# Patient Record
Sex: Male | Born: 1950
Health system: Southern US, Community
[De-identification: ages and names within clinical notes are randomized; demographics above are authoritative.]

## PROBLEM LIST (undated history)

## (undated) DIAGNOSIS — Z85828 Personal history of other malignant neoplasm of skin: Secondary | ICD-10-CM

## (undated) DIAGNOSIS — I444 Left anterior fascicular block: Secondary | ICD-10-CM

## (undated) DIAGNOSIS — I4510 Unspecified right bundle-branch block: Secondary | ICD-10-CM

## (undated) DIAGNOSIS — B019 Varicella without complication: Secondary | ICD-10-CM

## (undated) DIAGNOSIS — R6882 Decreased libido: Secondary | ICD-10-CM

## (undated) DIAGNOSIS — K602 Anal fissure, unspecified: Secondary | ICD-10-CM

## (undated) DIAGNOSIS — F329 Major depressive disorder, single episode, unspecified: Secondary | ICD-10-CM

## (undated) DIAGNOSIS — E785 Hyperlipidemia, unspecified: Secondary | ICD-10-CM

## (undated) HISTORY — DX: Varicella without complication: B01.9

## (undated) HISTORY — DX: Left anterior fascicular block: I44.4

## (undated) HISTORY — DX: Hyperlipidemia, unspecified: E78.5

## (undated) HISTORY — DX: Unspecified right bundle-branch block: I45.10

## (undated) HISTORY — PX: NASAL POLYP SURGERY: SHX186

## (undated) HISTORY — DX: Personal history of other malignant neoplasm of skin: Z85.828

## (undated) HISTORY — DX: Decreased libido: R68.82

## (undated) HISTORY — PX: TONSILLECTOMY: SUR1361

## (undated) HISTORY — DX: Major depressive disorder, single episode, unspecified: F32.9

## (undated) HISTORY — DX: Anal fissure, unspecified: K60.2

## (undated) HISTORY — PX: KNEE ARTHROSCOPY: SUR90

---

## 1998-08-02 ENCOUNTER — Ambulatory Visit (HOSPITAL_BASED_OUTPATIENT_CLINIC_OR_DEPARTMENT_OTHER): Admission: RE | Admit: 1998-08-02 | Discharge: 1998-08-02 | Payer: Self-pay | Admitting: General Surgery

## 2002-10-17 ENCOUNTER — Ambulatory Visit (HOSPITAL_BASED_OUTPATIENT_CLINIC_OR_DEPARTMENT_OTHER): Admission: RE | Admit: 2002-10-17 | Discharge: 2002-10-17 | Payer: Self-pay | Admitting: Otolaryngology

## 2002-11-16 ENCOUNTER — Encounter (INDEPENDENT_AMBULATORY_CARE_PROVIDER_SITE_OTHER): Payer: Self-pay | Admitting: Specialist

## 2002-11-16 ENCOUNTER — Ambulatory Visit (HOSPITAL_BASED_OUTPATIENT_CLINIC_OR_DEPARTMENT_OTHER): Admission: RE | Admit: 2002-11-16 | Discharge: 2002-11-17 | Payer: Self-pay | Admitting: Otolaryngology

## 2003-07-21 HISTORY — PX: SHOULDER ARTHROSCOPY W/ ROTATOR CUFF REPAIR: SHX2400

## 2008-05-14 ENCOUNTER — Encounter: Payer: Self-pay | Admitting: Family Medicine

## 2008-05-21 ENCOUNTER — Encounter: Payer: Self-pay | Admitting: Family Medicine

## 2008-06-11 ENCOUNTER — Encounter: Payer: Self-pay | Admitting: Family Medicine

## 2008-07-20 LAB — HM COLONOSCOPY: HM Colonoscopy: NORMAL

## 2008-12-25 ENCOUNTER — Ambulatory Visit: Payer: Self-pay | Admitting: Family Medicine

## 2008-12-25 DIAGNOSIS — F329 Major depressive disorder, single episode, unspecified: Secondary | ICD-10-CM

## 2008-12-25 DIAGNOSIS — Z85828 Personal history of other malignant neoplasm of skin: Secondary | ICD-10-CM

## 2008-12-25 DIAGNOSIS — F3289 Other specified depressive episodes: Secondary | ICD-10-CM

## 2008-12-25 DIAGNOSIS — R6882 Decreased libido: Secondary | ICD-10-CM

## 2008-12-25 HISTORY — DX: Major depressive disorder, single episode, unspecified: F32.9

## 2008-12-25 HISTORY — DX: Other specified depressive episodes: F32.89

## 2008-12-25 HISTORY — DX: Personal history of other malignant neoplasm of skin: Z85.828

## 2008-12-25 HISTORY — DX: Decreased libido: R68.82

## 2009-02-01 ENCOUNTER — Encounter: Payer: Self-pay | Admitting: Family Medicine

## 2009-03-05 ENCOUNTER — Ambulatory Visit: Payer: Self-pay | Admitting: Family Medicine

## 2009-03-05 LAB — CONVERTED CEMR LAB
ALT: 22 units/L (ref 0–53)
Albumin: 3.9 g/dL (ref 3.5–5.2)
BUN: 14 mg/dL (ref 6–23)
Basophils Absolute: 0.1 10*3/uL (ref 0.0–0.1)
Bilirubin Urine: NEGATIVE
CO2: 26 meq/L (ref 19–32)
Chloride: 109 meq/L (ref 96–112)
Glucose, Bld: 113 mg/dL — ABNORMAL HIGH (ref 70–99)
HCT: 42.6 % (ref 39.0–52.0)
HDL: 50.6 mg/dL (ref >39.00)
Hemoglobin: 15 g/dL (ref 13.0–17.0)
Ketones, ur: NEGATIVE mg/dL
Leukocytes, UA: NEGATIVE
Lymphs Abs: 1 10*3/uL (ref 0.7–4.0)
MCV: 88.6 fL (ref 78.0–100.0)
Monocytes Absolute: 0.5 10*3/uL (ref 0.1–1.0)
Neutro Abs: 3.5 10*3/uL (ref 1.4–7.7)
Nitrite: NEGATIVE
Platelets: 206 10*3/uL (ref 150.0–400.0)
Potassium: 3.8 meq/L (ref 3.5–5.1)
RDW: 12.5 % (ref 11.5–14.6)
Sodium: 140 meq/L (ref 135–145)
Specific Gravity, Urine: 1.025 (ref 1.000–1.030)
TSH: 0.8 microintl units/mL (ref 0.35–5.50)
Total Bilirubin: 1.2 mg/dL (ref 0.3–1.2)
Urobilinogen, UA: 0.2 (ref 0.0–1.0)
pH: 6 (ref 5.0–8.0)

## 2009-03-15 ENCOUNTER — Ambulatory Visit: Payer: Self-pay | Admitting: Family Medicine

## 2009-04-19 HISTORY — PX: FRACTURE SURGERY: SHX138

## 2009-05-17 ENCOUNTER — Ambulatory Visit (HOSPITAL_BASED_OUTPATIENT_CLINIC_OR_DEPARTMENT_OTHER): Admission: RE | Admit: 2009-05-17 | Discharge: 2009-05-17 | Payer: Self-pay | Admitting: Orthopedic Surgery

## 2009-05-24 ENCOUNTER — Telehealth: Payer: Self-pay | Admitting: Family Medicine

## 2009-09-20 ENCOUNTER — Ambulatory Visit: Payer: Self-pay | Admitting: Family Medicine

## 2010-03-11 ENCOUNTER — Ambulatory Visit: Payer: Self-pay | Admitting: Family Medicine

## 2010-07-09 ENCOUNTER — Ambulatory Visit: Payer: Self-pay | Admitting: Family Medicine

## 2010-08-19 NOTE — Assessment & Plan Note (Signed)
Summary: fup on meds//ccm   Vital Signs:  Patient profile:   60 year old male Weight:      222 pounds BMI:     31.07 Temp:     98.3 degrees F oral BP sitting:   130 / 78  (left arm) Cuff size:   large  Vitals Entered By: Sid Falcon LPN (March 11, 2010 8:40 AM)  History of Present Illness: Patient here for followup depression. Takes Zoloft and bupropion. Symptoms well-controlled. He is sleeping well. Positive mood. Has moved recently to [place] for job and is back here visiting. He plans to eventually get his own physician up there.  Exercises daily with walking. No suicidal ideation. Good appetite. No anhedonia.  Allergies: 1)  ! Cipro (Ciprofloxacin Hcl)  Past History:  Past Medical History: Last updated: 12/25/2008 Skin cancer, hx of, basal cell Depression Chicken pox Dyslipidemia Recurrent nasal polyps Pre diabetes by recent labs PMH reviewed for relevance  Physical Exam  General:  Well-developed,well-nourished,in no acute distress; alert,appropriate and cooperative throughout examination Neck:  No deformities, masses, or tenderness noted. Lungs:  Normal respiratory effort, chest expands symmetrically. Lungs are clear to auscultation, no crackles or wheezes. Heart:  Normal rate and regular rhythm. S1 and S2 normal without gallop, murmur, click, rub or other extra sounds. Psych:  normally interactive, good eye contact, not anxious appearing, and not depressed appearing.     Impression & Recommendations:  Problem # 1:  DEPRESSION (ICD-311) stable.  He will consider tapering off antidepressants in the coming year. His updated medication list for this problem includes:    Zoloft 50 Mg Tabs (Sertraline hcl) ..... Once daily    Bupropion Hcl 300 Mg Xr24h-tab (Bupropion hcl) ..... One by mouth once daily  Complete Medication List: 1)  Restasis 0.05 % Emul (Cyclosporine) .... One drop to each eye daily 2)  Singulair 10 Mg Tabs (Montelukast sodium) .... Once daily 3)   Zoloft 50 Mg Tabs (Sertraline hcl) .... Once daily 4)  Bupropion Hcl 300 Mg Xr24h-tab (Bupropion hcl) .... One by mouth once daily 5)  Ambien 10 Mg Tabs (Zolpidem tartrate) .... One by mouth at bedtime as needed insomnia  Patient Instructions: 1)  Please schedule a follow-up appointment in 1 year.  Prescriptions: BUPROPION HCL 300 MG XR24H-TAB (BUPROPION HCL) one by mouth once daily  #90 x 3   Entered and Authorized by:   Evelena Peat MD   Signed by:   Evelena Peat MD on 03/11/2010   Method used:   Faxed to ...       [facility] (mail-order)             , [place]         Ph:        Fax: [phone]   RxID:   [id number] ZOLOFT 50 MG TABS (SERTRALINE HCL) once daily  #90 x 3   Entered and Authorized by:   Evelena Peat MD   Signed by:   Evelena Peat MD on 03/11/2010   Method used:   Faxed to ...       [facility] (mail-order)             , [place]         Ph:        Fax: [phone]   RxID:   [id number] ZOLOFT 50 MG TABS (SERTRALINE HCL) once daily  #30 x 0   Entered and Authorized by:   Evelena Peat MD   Signed by:  Evelena Peat MD on 03/11/2010   Method used:   Electronically to        [facility]  [phone]* (retail)       [address] [place], [place]  [address]       Ph: [phone] or [phone]       Fax: [phone]   RxID:   [id number]

## 2010-08-19 NOTE — Assessment & Plan Note (Signed)
Summary: HEP A INJ/CJR  Nurse Visit   Allergies: 1)  ! Cipro (Ciprofloxacin Hcl)  Immunizations Administered:  Hepatitis A Vaccine # 2:    Vaccine Type: HepA    Site: left deltoid    Mfr: GlaxoSmithKline    Dose: 0.1 ml    Route: IM    Given by: Sid Falcon LPN    Exp. Date: 11/06/2011    Lot #: [id number]  Orders Added: 1)  Hepatitis A Vaccine (Adult Dose) [90632] 2)  Admin 1st Vaccine [90471] Prescriptions: AMBIEN 10 MG TABS (ZOLPIDEM TARTRATE) one by mouth at bedtime as needed insomnia  #30 x 2   Entered and Authorized by:   Evelena Peat MD   Signed by:   Evelena Peat MD on 09/20/2009   Method used:   Print then Give to Patient   RxID:   [id number]

## 2010-08-21 NOTE — Assessment & Plan Note (Signed)
Summary: fu  on meds/njr   Vital Signs:  Patient profile:   60 year old male Weight:      215 pounds Temp:     97.6 degrees F oral BP sitting:   120 / 70  (left arm) Cuff size:   large  Vitals Entered By: Sid Falcon LPN (July 09, 2010 8:48 AM) CC: follow-up visit on meds , Back Pain Is Patient Diabetic? No   History of Present Illness: Hx depression stable.  Ambulating to work and increased diaphoresis. Would like to taper back Wellbutrin.  Mood is stable.  On current meds for several years.   Allergies: 1)  ! Cipro (Ciprofloxacin Hcl)  Past History:  Past Medical History: Last updated: 12/25/2008 Skin cancer, hx of, basal cell Depression Chicken pox Dyslipidemia Recurrent nasal polyps Pre diabetes by recent labs PMH reviewed for relevance  Review of Systems  The patient denies anorexia, fever, chest pain, dyspnea on exertion, peripheral edema, prolonged cough, and headaches.    Physical Exam  General:  Well-developed,well-nourished,in no acute distress; alert,appropriate and cooperative throughout examination Neck:  No deformities, masses, or tenderness noted. Lungs:  Normal respiratory effort, chest expands symmetrically. Lungs are clear to auscultation, no crackles or wheezes. Heart:  Normal rate and regular rhythm. S1 and S2 normal without gallop, murmur, click, rub or other extra sounds. Psych:  normally interactive, good eye contact, not anxious appearing, and not depressed appearing.     Impression & Recommendations:  Problem # 1:  DEPRESSION (ICD-311) Assessment Improved taper back Wellbutrin which should help with diaphoresis His updated medication list for this problem includes:    Zoloft 50 Mg Tabs (Sertraline hcl) ..... Once daily    Bupropion Hcl 150 Mg Xr24h-tab (Bupropion hcl) ..... One by mouth once daily  Complete Medication List: 1)  Restasis 0.05 % Emul (Cyclosporine) .... One drop to each eye daily 2)  Singulair 10 Mg Tabs  (Montelukast sodium) .... Once daily 3)  Zoloft 50 Mg Tabs (Sertraline hcl) .... Once daily 4)  Bupropion Hcl 150 Mg Xr24h-tab (Bupropion hcl) .... One by mouth once daily 5)  Ambien 10 Mg Tabs (Zolpidem tartrate) .... One by mouth at bedtime as needed insomnia  Other Orders: Admin 1st Vaccine (01027) Flu Vaccine 80yrs + (25366) Prescriptions: BUPROPION HCL 150 MG XR24H-TAB (BUPROPION HCL) one by mouth once daily  #30 x 11   Entered and Authorized by:   Evelena Peat MD   Signed by:   Evelena Peat MD on 07/09/2010   Method used:   Print then Give to Patient   RxID:   [id number]    Orders Added: 1)  Est. Patient Level III [64332] 2)  Admin 1st Vaccine [90471] 3)  Flu Vaccine 8yrs + [95188]   Flu Vaccine Consent Questions     Do you have a history of severe allergic reactions to this vaccine? no    Any prior history of allergic reactions to egg and/or gelatin? no    Do you have a sensitivity to the preservative Thimersol? no    Do you have a past history of Guillan-Barre Syndrome? no    Do you currently have an acute febrile illness? no    Have you ever had a severe reaction to latex? no    Vaccine information given and explained to patient? yes    Are you currently pregnant? no    Lot Number:[id number]   Exp Date:01/17/2011   Site Given  Left Deltoid IM        .  lbflu

## 2010-10-23 LAB — POCT HEMOGLOBIN-HEMACUE: Hemoglobin: 14.8 g/dL (ref 13.0–17.0)

## 2010-12-05 NOTE — Op Note (Signed)
NAME:  Eric [name], Eric [name]                        ACCOUNT NO.:  [id number]   MEDICAL RECORD NO.:  [id number]                   PATIENT TYPE:  AMB   LOCATION:  [facility]                                  FACILITY:  [facility]   PHYSICIAN:  Kinnie Scales. Annalee Genta, M.D.            DATE OF BIRTH:  September 17, 1950   DATE OF PROCEDURE:  11/16/2002  DATE OF DISCHARGE:                                 OPERATIVE REPORT   PREOPERATIVE DIAGNOSES:  1. Chronic sinusitis.  2. Nasal polyposis.  3. Deviated nasal septum.  4. Inferior turbinate hypertrophy.   POSTOPERATIVE DIAGNOSES:  1. Chronic sinusitis.  2. Nasal polyposis.  3. Deviated nasal septum.  4. Inferior turbinate hypertrophy.   INDICATIONS FOR PROCEDURE:  1. Chronic sinusitis.  2. Nasal polyposis.  3. Deviated nasal septum.  4. Inferior turbinate hypertrophy.   LOCATION:  Outpatient setting.   ANESTHESIA:  An otherwise healthy patient requiring general anesthesia.   SURGICAL PROCEDURE:  1. Bilateral endoscopic sinus surgery under fluoroscopic guidance consisting     of  bilateral total ethmoidectomy, bilateral maxillary antrostomy with     removal of diseased sinus tissue and left sphenoidotomy.  2. Nasal septoplasty.  3. Bilateral inferior turbinate electrocautery.   ANESTHESIA:  General endotracheal.   COMPLICATIONS:  None.   ESTIMATED BLOOD LOSS:  250 cc.   DISPOSITION:  The patient transferred from the operating room to the  recovery room in stable condition.   BRIEF HISTORY:  The patient is a 60 year old white male who was referred for  evaluation of chronic nasal airway obstruction, hyposmia and chronic  sinusitis.  Examination in the office showed severe nasal polyposis and  deviated nasal septum and a CT scan was obtained which showed complete  opacification of the maxillary, ethmoid and sphenoid sinuses.  The patient  had hypoplastic frontal sinus and severely deviated nasal septum.  Given the  progressive nature of the patient's  symptoms and findings on examination,  the InstaTrak CT scan was obtained and the patient was scheduled for the  above surgical procedures.  Prior to surgery, he was pretreated with a one  week course of a Medrol taper in order to reduce bleeding and polypoid  swelling.  The risks and benefits and possible complications of the surgical  procedure were discussed in detail with the patient and his wife at the  preoperative appointment several days before surgery.  The risks and  benefits and possible complications were discussed in detail and they  understood and concurred with our plan for surgery which was scheduled as  above.   DESCRIPTION OF PROCEDURE:  The patient was brought to the operating room on  11/16/02 and placed in a supine position on the operating room table.  General endotracheal anesthesia was established without difficulty and the  patient was adequately anesthetized.  He was injected with a total of 15 cc  of 1% lidocaine with 1:100,000 solution epinephrine.  This was  injected in  the submucosal fashion on the nasal septum, inferior turbinates, lateral  nasal wall, uncinate process, nasal polyps and middle turbinate.  He was  also injected via transoral sphenopalatine block.  The patient's nose was  then packed with Afrin-soaked cottonoid pledgets that were left in place for  approximately 10 minutes to allow for vasoconstriction anesthesia.  The  patient was prepped and draped in a sterile fashion and the InstaTrak head  gear was applied.  Anatomic and surgical landmarks were identified and  confirmed.  The procedure was begun on the patient's right-hand side.  Using  a 0 degree endoscope, removal of the cottonoid pledgets and examination of  the nasal cavity.  He was found to have a severely deviated nasal septum  with inferior septal spurring and partial obstruction.  The middle meatus  and superior nasal cavity were completely occluded with polypoid material.  Using a  0 degree telescope and a straight microdebrider, polypoid disease  was resected from the nasal cavity and middle meatus to the level of the  ethmoid bulla.  The uncinate process was identified, reflected medially and  resected in its entirety with through cutting forceps.  The ethmoid bulla  was transected using the microdebrider and dissected from inferior to  superior.  Using the [facility] navigation system for guidance, dissection  was carried from anterior to posterior through the ethmoid air cells.  Significant polypoid material was encountered throughout the ethmoid region.  The posterior ethmoid air cells were identified.  This was confirmed with  the InstaTrak and the dissection was carried out from posterior to anterior  along the roof of the ethmoid sinus.  Diseased mucosa, polyps and bony  septations were resected using through cutting forceps and a microdebrider.  A 30 degree upcutting microdebrider was then used along the roof of the  ethmoid sinus to remove residual polypoid material.  Attention was then  turned to the lateral nasal wall where residual uncinate process was  resected.  The natural os of the maxillary sinus was identified.  It was  enlarged in an anterior, inferior and posterior direction.  Large polypoid  material was then resected from within the right maxillary sinus, leaving  the maxillary sinus ostia patent and the majority of the polypoid material  resected.   Attention was then turned to the patient's nasal septum.  A right  hemitransfixion incision was created and this was carried through the mucosa  and drawing submucosa and the mucoperiosteal flap was elevated from anterior  to posterior along the patient's right-hand side.  Severely deviated bone  and cartilage were resected using through cutting forceps and a 4 mm  osteotome.  A large inferior septal spur was resected, preserving the overlying mucosa.  With resection brought to the midline, anterior  cartilage  was morselized and returned to the mucoperichondrial pocket.  Columellar and  dorsal nasal cartilage were not implemented as these were not significantly  deviated.  The mucoperichondrial flaps were reapproximated with a 4-0 gut  suture on a Keith needle in a horizontal mattress running fashion and the  hemitransfixion incision was closed with the same stitch.  At the conclusion  of the surgical procedure, bilateral Doyle nasal septal splints were placed  after the application of Bactroban ointment and sutured into position with a  3-0 Ethilon suture.   Attention was then turned to the patient's left-hand side where endoscopic  sinus surgery was undertaken.  Cottonoid pledgets were removed.  The nasal  cavity  was examined.  The patient was found to have a severe lateralization  of the left middle turbinate with complete occlusion of the left middle  meatus.  The middle turbinate was gently medialized and heavy polypoid  material was encountered within the middle meatus.  This was resected using  a straight microdebrider.  The uncinate process was identified and reflected  medially.  It was resected in its entirety from inferior to superior.  The  ethmoid bulla was identified.  This was also resected from inferior to  superior.  Resection was then carried out through the ethmoid air cells from  anterior to posterior.  Significant polypoid disease was encountered and  this was resected in its entirety.  The posterior aspect of the middle  turbinate was then partially resected and the natural ostia of the sphenoid  sinus was identified.  This was enlarged in an inferior and lateral  direction and polypoid material was removed from the meatus.  The sinus  itself was inspected.  There was minimal polypoid material and no tissue was  resected.  Using a 30 degree telescope, the roof of the ethmoid sinus was  identified under direct visualization and InstaTrak guidance.  The roof of   the ethmoid sinus was dissected free of polypoid material and bony  septations.  Attention was then turned to the lateral nasal wall where the  natural ostium of the maxillary sinus was identified.  Again, residual  uncinate process was resected, creating a widely patent left maxillary  ostium.  Diseased mucosa went into the maxillary sinus including polyps were  then resected using a curved microdebrider.   Attention was then turned to the patient's inferior turbinates where  bilateral inferior turbinates electrocautery was performed.  The cautery was  set at 12 watts and two passes were made in a submucosal fashion of each  inferior turbinate.  The inferior turbinates were then out fractured to  create a more patent nasal cavity.  The patient's nasal cavity and  nasopharynx, oral cavity and oropharynx were then thoroughly irrigated with  saline and suctioned.  Surgical debris was cleared from the sinus cavities and a 50:50 mix of Bactroban ointment and Kenalog 40 was then instilled into  the patient's ethmoid and maxillary sinuses bilaterally.  Bilateral Kennedy  packs were then placed within the common ethmoid cavity and middle meatus.  The patient was then awakened from his anesthetic.  The orogastric tube was  passed and the stomach contents were aspirated.  The patient was fully  awakened, extubated and was then transferred from the operating room to the  recovery room in stable condition.  There were no complications.  Estimated  blood loss 250 cc.                                                Kinnie Scales. Annalee Genta, M.D.    DLS/MEDQ  D:  16/04/9603  T:  11/16/2002  Job:  [id number]

## 2011-08-23 ENCOUNTER — Ambulatory Visit (INDEPENDENT_AMBULATORY_CARE_PROVIDER_SITE_OTHER): Payer: 59 | Admitting: Family Medicine

## 2011-08-23 VITALS — BP 147/89 | HR 90 | Temp 98.0°F | Resp 16 | Ht 70.25 in | Wt 233.6 lb

## 2011-08-23 DIAGNOSIS — R197 Diarrhea, unspecified: Secondary | ICD-10-CM

## 2011-08-23 DIAGNOSIS — R3589 Other polyuria: Secondary | ICD-10-CM

## 2011-08-23 DIAGNOSIS — R358 Other polyuria: Secondary | ICD-10-CM

## 2011-08-23 LAB — POCT URINALYSIS DIPSTICK
Bilirubin, UA: NEGATIVE
Glucose, UA: NEGATIVE
Ketones, UA: NEGATIVE
Leukocytes, UA: NEGATIVE
Nitrite, UA: NEGATIVE
Spec Grav, UA: >=1.03
Urobilinogen, UA: 0.2
pH, UA: 5.5

## 2011-08-23 LAB — POCT UA - MICROSCOPIC ONLY
Casts, Ur, LPF, POC: NEGATIVE
Crystals, Ur, HPF, POC: NEGATIVE
Mucus, UA: POSITIVE
Yeast, UA: NEGATIVE

## 2011-08-23 MED ORDER — METRONIDAZOLE 500 MG PO TABS: 500.0000 mg | ORAL_TABLET | Freq: Three times a day (TID) | ORAL | Status: DC

## 2011-08-23 NOTE — Patient Instructions (Signed)
Diarrhea Infections caused by germs (bacterial) or a virus commonly cause diarrhea. Your caregiver has determined that with time, rest and fluids, the diarrhea should improve. In general, eat normally while drinking more water than usual. Although water may prevent dehydration, it does not contain salt and minerals (electrolytes). Broths, weak tea without caffeine and oral rehydration solutions (ORS) replace fluids and electrolytes. Small amounts of fluids should be taken frequently. Large amounts at one time may not be tolerated. Plain water may be harmful in infants and the elderly. Oral rehydrating solutions (ORS) are available at pharmacies and grocery stores. ORS replace water and important electrolytes in proper proportions. Sports drinks are not as effective as ORS and may be harmful due to sugars worsening diarrhea.  ORS is especially recommended for use in children with diarrhea. As a general guideline for children, replace any new fluid losses from diarrhea and/or vomiting with ORS as follows:   If your child weighs 22 pounds or under (10 kg or less), give 60-120 mL ( -  cup or 2 - 4 ounces) of ORS for each episode of diarrheal stool or vomiting episode.   If your child weighs more than 22 pounds (more than 10 kgs), give 120-240 mL ( - 1 cup or 4 - 8 ounces) of ORS for each diarrheal stool or episode of vomiting.   While correcting for dehydration, children should eat normally. However, foods high in sugar should be avoided because this may worsen diarrhea. Large amounts of carbonated soft drinks, juice, gelatin desserts and other highly sugared drinks should be avoided.   After correction of dehydration, other liquids that are appealing to the child may be added. Children should drink small amounts of fluids frequently and fluids should be increased as tolerated. Children should drink enough fluids to keep urine clear or pale yellow.   Adults should eat normally while drinking more fluids  than usual. Drink small amounts of fluids frequently and increase as tolerated. Drink enough fluids to keep urine clear or pale yellow. Broths, weak decaffeinated tea, lemon lime soft drinks (allowed to go flat) and ORS replace fluids and electrolytes.   Avoid:   Carbonated drinks.   Juice.   Extremely hot or cold fluids.   Caffeine drinks.   Fatty, greasy foods.   Alcohol.   Tobacco.   Too much intake of anything at one time.   Gelatin desserts.   Probiotics are active cultures of beneficial bacteria. They may lessen the amount and number of diarrheal stools in adults. Probiotics can be found in yogurt with active cultures and in supplements.   Wash hands well to avoid spreading bacteria and virus.   Anti-diarrheal medications are not recommended for infants and children.   Only take over-the-counter or prescription medicines for pain, discomfort or fever as directed by your caregiver. Do not give aspirin to children because it may cause Reye's Syndrome.   For adults, ask your caregiver if you should continue all prescribed and over-the-counter medicines.   If your caregiver has given you a follow-up appointment, it is very important to keep that appointment. Not keeping the appointment could result in a chronic or permanent injury, and disability. If there is any problem keeping the appointment, you must call back to this facility for assistance.  SEEK IMMEDIATE MEDICAL CARE IF:   You or your child is unable to keep fluids down or other symptoms or problems become worse in spite of treatment.   Vomiting or diarrhea develops and becomes persistent.     There is vomiting of blood or bile (green material).   There is blood in the stool or the stools are black and tarry.   There is no urine output in 6-8 hours or there is only a small amount of very dark urine.   Abdominal pain develops, increases or localizes.   You have a fever.   Your baby is older than 3 months with a  rectal temperature of 102 F (38.9 C) or higher.   Your baby is 3 months old or younger with a rectal temperature of 100.4 F (38 C) or higher.   You or your child develops excessive weakness, dizziness, fainting or extreme thirst.   You or your child develops a rash, stiff neck, severe headache or become irritable or sleepy and difficult to awaken.  MAKE SURE YOU:   Understand these instructions.   Will watch your condition.   Will get help right away if you are not doing well or get worse.  Document Released: 06/26/2002 Document Revised: 03/18/2011 Document Reviewed: 05/13/2009 Exit[employer] Patient Information 2012 [employer]. 

## 2011-08-23 NOTE — Progress Notes (Signed)
Addended by: Elvina Sidle on: 08/23/2011 10:59 AM   Modules accepted: Orders

## 2011-08-23 NOTE — Progress Notes (Addendum)
  Subjective:    Patient ID: Eric [name], male    DOB: Jan 20, 1951, 61 y.o.   MRN: [id number]  Abdominal Pain This is a new problem. The current episode started in the past 7 days. The onset quality is sudden. The problem occurs intermittently. The problem has been gradually worsening. The pain is located in the generalized abdominal region. The pain is mild. The quality of the pain is cramping. Associated symptoms include diarrhea. The pain is aggravated by bowel movement. The pain is relieved by bowel movements.  Diarrhea  Associated symptoms include abdominal pain.   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  He has a history of C.Diff after travel to [place] and Rx for Cipro.  Keeping fluids down   Review of Systems  Gastrointestinal: Positive for abdominal pain and diarrhea.       Objective:   Physical Exam  Constitutional: He appears well-developed.  Eyes: Conjunctivae are normal. Pupils are equal, round, and reactive to light.  Neck: Normal range of motion. Neck supple.  Cardiovascular: Normal rate and normal heart sounds.   Pulmonary/Chest: Effort normal and breath sounds normal.  Abdominal: Soft. Bowel sounds are normal. He exhibits no distension and no mass. There is no tenderness.  Skin: Skin is warm and dry.  Psychiatric: His behavior is normal.    U/A negative      Assessment & Plan:  Acute diarrheal illness with h/o C. Diff.

## 2011-08-25 ENCOUNTER — Telehealth: Payer: Self-pay

## 2011-08-25 LAB — CLOSTRIDIUM DIFFICILE EIA: CDIFTX: POSITIVE

## 2011-08-25 NOTE — Telephone Encounter (Signed)
Dr. Darrick Grinder, Got a call from [facility] stating that this pt had a pos C. Diff. Talked to [facility] and he said to let pt know and to have pt f/u with you tomorrow to recheck hydration. Pt notified.

## 2011-08-25 NOTE — Telephone Encounter (Signed)
Please fax or route info to PCP

## 2011-08-25 NOTE — Telephone Encounter (Signed)
SPOKE WITH PT AND ASSURED RESULTS AND OV WOULD BE THERE FOR HIS APPT TOMORROW. DR Caryl Never TO FOLLOW UP WITH PT

## 2011-08-25 NOTE — Telephone Encounter (Signed)
.  [facility]    PT WAS CALLED TO COME BACK IN AFTER LAB RESULTS,BUT CHOOSED TO GO TO HIS PRIMARY CARE PHY,HE WILL NEED COPIES OF LABS,OV,ETC FOR VISIT,  PLEASE CALL [phone] WHEN READY TO PICK UP.

## 2011-08-26 ENCOUNTER — Ambulatory Visit (INDEPENDENT_AMBULATORY_CARE_PROVIDER_SITE_OTHER): Payer: Self-pay | Admitting: Family Medicine

## 2011-08-26 ENCOUNTER — Encounter: Payer: Self-pay | Admitting: Family Medicine

## 2011-08-26 VITALS — BP 150/78 | Temp 98.1°F | Wt 238.0 lb

## 2011-08-26 DIAGNOSIS — R197 Diarrhea, unspecified: Secondary | ICD-10-CM

## 2011-08-26 LAB — STOOL CULTURE

## 2011-08-26 MED ORDER — SERTRALINE HCL 100 MG PO TABS: 100.0000 mg | ORAL_TABLET | Freq: Every day | ORAL | Status: DC

## 2011-08-26 MED ORDER — METRONIDAZOLE 500 MG PO TABS: 500.0000 mg | ORAL_TABLET | Freq: Three times a day (TID) | ORAL | Status: AC

## 2011-08-26 NOTE — Patient Instructions (Signed)
Clostridium Difficile Infection Clostridium difficile (C. diff) is a bacteria found in the intestinal tract or colon. Under certain conditions, it causes diarrhea and sometimes severe disease. The severe form of the disease is known as pseudomembranous colitis (often called C. diff colitis). This disease can damage the lining of the colon or cause the colon to become enlarged (toxic megacolon).  CAUSES  Your colon normally contains many different bacteria, including C. diff. The balance of bacteria in your colon can change during illness. This is especially true when you take antibiotic medicine. Taking antibiotics may allow the C. diff to grow, multiply excessively, and make a toxin that then causes illness. The elderly and people with certain medical conditions have a greater risk of getting C. diff infections. SYMPTOMS   Watery diarrhea.   Fever.   Fatigue.   Loss of appetite.   Nausea.   Abdominal swelling, pain, or tenderness.   Dehydration.  DIAGNOSIS  Your symptoms may make your caregiver suspicious of a C. diff infection, especially if you have used antibiotics in the preceding weeks. However, there are only 2 ways to know for certain whether you have a C. diff infection:  A lab test that finds the toxin in your stool.   The specific appearance of an abnormality (pseudomembrane) in your colon. This can only be seen by doing a sigmoidoscopy or colonoscopy. These procedures involve passing an instrument through your rectum to look at the inside of your colon.  Your caregiver will help determine if these tests are necessary. TREATMENT   Most people are successfully treated with 1 of 2 specific antibiotics, usually given by mouth. Other antibiotics you are receiving are stopped if possible.   Intravenous (IV) fluids and correction of electrolyte imbalance may be necessary.   Rarely, surgery may be needed to remove the infected part of the intestines.   Careful hand washing by  you and your caregivers is important to prevent the spread of infection. In the hospital, your caregivers may also put on gowns and gloves to prevent the spread of the C. diff bacteria. Your room is also cleaned regularly with a hospital grade disinfectant.  HOME CARE INSTRUCTIONS  Drink enough fluids to keep your urine clear or pale yellow. Avoid milk, caffeine, and alcohol.   Ask your caregiver for specific rehydration instructions.   Try eating small, frequent meals rather than large meals.   Take your antibiotics as directed. Finish them even if you start to feel better.   Do not use medicines to slow diarrhea. This could delay healing or cause complications.   Wash your hands thoroughly after using the bathroom and before preparing food.   Make sure people who live with you wash their hands often, too.  SEEK MEDICAL CARE IF:  Diarrhea persists longer than expected or recurs after completing your course of antibiotic treatment for the C. diff infection.   You have trouble staying hydrated.  SEEK IMMEDIATE MEDICAL CARE IF:  You develop a new fever.   You have increasing abdominal pain or tenderness.   There is blood in your stools, or your stools are dark black and tarry.   You cannot hold down food or liquids.  MAKE SURE YOU:   Understand these instructions.   Will watch your condition.   Will get help right away if you are not doing well or get worse.  Document Released: 04/15/2005 Document Revised: 03/18/2011 Document Reviewed: 12/12/2010 [employer] Patient Information 2012 [employer]. 

## 2011-08-26 NOTE — Progress Notes (Signed)
  Subjective:    Patient ID: Eric [name], male    DOB: 04/11/1951, 61 y.o.   MRN: [id number]  HPI  Followup from recent urgent care visit. Patient had onset of watery nonbloody diarrhea last Friday. He was going approximately every hour. He had associated severe diffuse abdominal cramps. No fever and no nausea or vomiting. Sunday went to urgent care. He recalls feeling similar with C. difficile following Cipro several years ago. Bacterial culture negative. C. difficile assay positive. The patient had been started on Flagyl 500 mg 3 times a day for 7 days. He is about 50% better in terms of diarrhea reduction 75% better in terms of abdominal cramp reduction. No fever. Drinking fluids well.  Travels frequently for job but no recent travels. No recent antibiotics. He has had recurrent sinusitis in the past and is using some type of nebulizer which combines antifungal with steroid.  History of recurrent depression. Currently sertraline 100 mg daily. Patient would like to try reducing to 50 mg. Needs refills. Depression stable   Review of Systems  Constitutional: Negative for fever and chills.  Respiratory: Negative for cough and shortness of breath.   Cardiovascular: Negative for chest pain.  Gastrointestinal: Positive for abdominal pain and diarrhea. Negative for nausea and vomiting.  Neurological: Negative for dizziness.       Objective:   Physical Exam  Constitutional: He appears well-developed and well-nourished.  HENT:  Mouth/Throat: Oropharynx is clear and moist.  Cardiovascular: Normal rate and regular rhythm.   Pulmonary/Chest: Effort normal and breath sounds normal. No respiratory distress. He has no wheezes. He has no rales.  Abdominal: Soft. Bowel sounds are normal. He exhibits no distension and no mass. There is no tenderness. There is no rebound and no guarding.  Psychiatric: He has a normal mood and affect. His behavior is normal.          Assessment & Plan:  #1  diarrhea secondary to Clostridium difficile. No recent clear risk factors. We recommend extending Flagyl 500 mg 3 times a day to full 14 days and follow up promptly for any recurrent symptoms or if symptoms not fully resolved after completion of antibiotics. He knows to avoid alcohol while on Flagyl #2 recurrent depression stable. Refilled sertraline. Try tapering sertraline to 50 mg daily

## 2011-08-28 ENCOUNTER — Ambulatory Visit (INDEPENDENT_AMBULATORY_CARE_PROVIDER_SITE_OTHER): Payer: Self-pay | Admitting: Family Medicine

## 2011-08-28 ENCOUNTER — Encounter: Payer: Self-pay | Admitting: Family Medicine

## 2011-08-28 VITALS — BP 130/80 | Temp 98.3°F | Wt 238.0 lb

## 2011-08-28 DIAGNOSIS — M545 Low back pain: Secondary | ICD-10-CM

## 2011-08-28 DIAGNOSIS — A0472 Enterocolitis due to Clostridium difficile, not specified as recurrent: Secondary | ICD-10-CM

## 2011-08-28 LAB — BASIC METABOLIC PANEL
Calcium: 8.8 mg/dL (ref 8.4–10.5)
Creatinine, Ser: 0.7 mg/dL (ref 0.4–1.5)
GFR: 118.94 mL/min (ref >60.00)
Sodium: 139 mEq/L (ref 135–145)

## 2011-08-28 NOTE — Patient Instructions (Signed)
Foods Rich in Potassium Food / Potassium (mg)  Apricots, dried,  cup / 378 mg   Apricots, raw, 1 cup halves / 401 mg   Avocado,  / 487 mg   Banana, 1 large / 487 mg   Beef, lean, round, 3 oz / 202 mg   Cantaloupe, 1 cup cubes / 427 mg   Dates, medjool, 5 whole / 835 mg   Ham, cured, 3 oz / 212 mg   Lentils, dried,  cup / 458 mg   Lima beans, frozen,  cup / 258 mg   Orange, 1 large / 333 mg   Orange juice, 1 cup / 443 mg   Peaches, dried,  cup / 398 mg   Peas, split, cooked,  cup / 355 mg   Potato, boiled, 1 medium / 515 mg   Prunes, dried, uncooked,  cup / 318 mg   Raisins,  cup / 309 mg   Salmon, pink, raw, 3 oz / 275 mg   Sardines, canned , 3 oz / 338 mg   Tomato, raw, 1 medium / 292 mg   Tomato juice, 6 oz / 417 mg   [place], 3 oz / 349 mg  Document Released: 07/06/2005 Document Revised: 03/18/2011 Document Reviewed: 11/19/2008 [facility] Patient Information 2012 [place], [place].  Follow up promptly for any fever, vomiting, or worsening abdominal pain.  Also touch base by next week if diarrhea not resolving.

## 2011-08-28 NOTE — Progress Notes (Signed)
  Subjective:    Patient ID: Eric [name], male    DOB: April 19, 1951, 61 y.o.   MRN: [id number]  HPI  Followup. Recent diagnosis Clostridium difficile diarrhea. His diarrhea is slightly improved but he was concerned because he has some right lower lumbar back pain yesterday. He continues to have some abdominal cramps though no worse. He denies any fever or chills. No bloody stools. Is drinking fluids. Low back pain right lower lumbar. No recent injury. No radiculopathy symptoms. No dysuria. No weakness or numbness lower extremity. Took Aleve with moderate relief.   Review of Systems  Constitutional: Negative for fever and chills.  Respiratory: Negative for cough and shortness of breath.   Cardiovascular: Negative for chest pain.  Gastrointestinal: Positive for abdominal pain and diarrhea. Negative for nausea, vomiting and blood in stool.  Genitourinary: Negative for dysuria.  Musculoskeletal: Positive for back pain.       Objective:   Physical Exam  Constitutional: He appears well-developed and well-nourished.  HENT:  Mouth/Throat: Oropharynx is clear and moist.  Cardiovascular: Normal rate and regular rhythm.   Pulmonary/Chest: Effort normal and breath sounds normal. No respiratory distress. He has no wheezes. He has no rales.  Abdominal: Soft. Bowel sounds are normal. He exhibits no distension and no mass. There is no tenderness. There is no rebound and no guarding.          Assessment & Plan:  #1 Clostridium difficile diarrhea. Slowly improving. Continue metformin. If not seeing resolution by next week consider change to oral vancomycin. He has seen reduction in terms of number of stools and now semi-formed. Check basic metabolic panel to rule out hypokalemia #2 lumbar back pain. Doubt related to #1.  continue Aleve. Avoid opioids with C. difficile colitis

## 2011-08-31 ENCOUNTER — Telehealth: Payer: Self-pay | Admitting: *Deleted

## 2011-08-31 NOTE — Telephone Encounter (Signed)
Notified pt. To pick up Vancomycin at [facility].

## 2011-08-31 NOTE — Telephone Encounter (Signed)
If he has any rash (eg hives) would look at alternatives.  If only mild itching may be OK to observe for now.  Obviously, would also need to stop for any tongue or lip swelling.  Is his diarrhea any improved?

## 2011-08-31 NOTE — Telephone Encounter (Signed)
Pt started with a rash (itchy) on day 7 of Flagyl, and is asking if Dr. Caryl Never would like him to stop it or what his recommendations would be

## 2011-08-31 NOTE — Telephone Encounter (Signed)
D/C metronidazole.  Start Vancomycin 250 mg po qid for 7 more days (we will have to check with local pharmacies that compound to see about availability).

## 2011-08-31 NOTE — Telephone Encounter (Signed)
The rash is continuing to get worse, and it does sound like hives, but pt is not familiar with what a hive looks like.  His diarrhea is better, and he has not tongue, mouth or lip swelling.   Per Dr. Caryl Never, we will make some changes on the phone, and call him back.

## 2011-09-07 ENCOUNTER — Encounter: Payer: Self-pay | Admitting: Family Medicine

## 2011-09-07 ENCOUNTER — Ambulatory Visit (INDEPENDENT_AMBULATORY_CARE_PROVIDER_SITE_OTHER): Payer: Self-pay | Admitting: Family Medicine

## 2011-09-07 ENCOUNTER — Ambulatory Visit (INDEPENDENT_AMBULATORY_CARE_PROVIDER_SITE_OTHER)
Admission: RE | Admit: 2011-09-07 | Discharge: 2011-09-07 | Disposition: A | Discharge status: Home / Self Care | Payer: Self-pay | Source: Ambulatory Visit | Attending: Family Medicine | Admitting: Family Medicine

## 2011-09-07 ENCOUNTER — Telehealth: Payer: Self-pay | Admitting: *Deleted

## 2011-09-07 VITALS — BP 128/70 | HR 78 | Temp 98.6°F

## 2011-09-07 DIAGNOSIS — A0472 Enterocolitis due to Clostridium difficile, not specified as recurrent: Secondary | ICD-10-CM

## 2011-09-07 DIAGNOSIS — R109 Unspecified abdominal pain: Secondary | ICD-10-CM

## 2011-09-07 DIAGNOSIS — R1084 Generalized abdominal pain: Secondary | ICD-10-CM

## 2011-09-07 DIAGNOSIS — R1110 Vomiting, unspecified: Secondary | ICD-10-CM

## 2011-09-07 LAB — BASIC METABOLIC PANEL
CO2: 24 mEq/L (ref 19–32)
Calcium: 8.9 mg/dL (ref 8.4–10.5)
Glucose, Bld: 96 mg/dL (ref 70–99)
Sodium: 137 mEq/L (ref 135–145)

## 2011-09-07 LAB — CBC WITH DIFFERENTIAL/PLATELET
Basophils Absolute: 0.1 10*3/uL (ref 0.0–0.1)
Eosinophils Absolute: 0.4 10*3/uL (ref 0.0–0.7)
Hemoglobin: 15.7 g/dL (ref 13.0–17.0)
Lymphocytes Relative: 15.1 % (ref 12.0–46.0)
Lymphs Abs: 1.2 10*3/uL (ref 0.7–4.0)
MCHC: 34.3 g/dL (ref 30.0–36.0)
Neutro Abs: 5.5 10*3/uL (ref 1.4–7.7)
RDW: 13.2 % (ref 11.5–14.6)

## 2011-09-07 LAB — AMYLASE: Amylase: 48 U/L (ref 27–131)

## 2011-09-07 LAB — HEPATIC FUNCTION PANEL: Albumin: 4 g/dL (ref 3.5–5.2)

## 2011-09-07 MED ORDER — PROMETHAZINE HCL 25 MG/ML IJ SOLN
25.0000 mg | Freq: Once | INTRAMUSCULAR | Status: AC
  Administered 2011-09-07: 25 mg via INTRAMUSCULAR

## 2011-09-07 MED ORDER — ONDANSETRON 8 MG PO TBDP: 8.0000 mg | ORAL_TABLET | Freq: Three times a day (TID) | ORAL | Status: AC | PRN

## 2011-09-07 NOTE — Telephone Encounter (Signed)
Pt. Is coming this afternoon for Dr. Caryl Never. Dr. Caryl Never aware and case discussed.

## 2011-09-07 NOTE — Telephone Encounter (Addendum)
Pt left message re: whether to come in or referral to specialist.  Could not understand the message, so left message for pt to call  back.

## 2011-09-07 NOTE — Patient Instructions (Signed)
Follow up immediately for any fever, progressive abdominal pain, or persistent vomiting

## 2011-09-07 NOTE — Progress Notes (Addendum)
  Subjective:    Patient ID: Eric [name], male    DOB: [birth year], 61 y.o.   MRN: [id number]  HPI  Patient recently diagnosed C. difficile colitis. Surprisingly, he had not been on any recent antibiotics. Similar episode years ago. Initially treated with Flagyl. He had rash possibly related to Flagyl and we subsequently switched to oral vancomycin-one week ago. Initially had about 15 stools per day now decreased about 5 stools per day. He now presents with one-day history of vomiting and some nausea.  He has some diffuse abdominal cramping like pains. No fever. No bloody stools.  Past Medical History  Diagnosis Date  . DEPRESSION 12/25/2008  . LIBIDO, DECREASED 12/25/2008  . SKIN CANCER, HX OF 12/25/2008  . Chicken pox    Past Surgical History  Procedure Date  . Tonsillectomy   . Knee arthroscopy 1994, 1996    right, left  . Shoulder arthroscopy w/ rotator cuff repair 2005  . Nasal polyp surgery 2004, 2009    reports that he quit smoking about 4 years ago. His smoking use included Cigarettes. He does not have any smokeless tobacco history on file. His alcohol and drug histories not on file. family history includes Alzheimer's disease (age of onset:64) in his father and Cancer in his father. Allergies  Allergen Reactions  . Ciprofloxacin     REACTION: GI upset, C-diff      Review of Systems  Constitutional: Positive for fatigue. Negative for fever and chills.  Respiratory: Negative for shortness of breath.   Cardiovascular: Negative for chest pain.  Gastrointestinal: Positive for nausea, vomiting, abdominal pain and diarrhea. Negative for blood in stool.  Genitourinary: Negative for dysuria.  Neurological: Positive for weakness. Negative for syncope.       Objective:   Physical Exam  Constitutional: He is oriented to person, place, and time. He appears well-developed and well-nourished.  HENT:  Mouth/Throat: Oropharynx is clear and moist.  Cardiovascular: Normal rate and  regular rhythm.   Pulmonary/Chest: Effort normal. No respiratory distress. He has no wheezes. He has no rales.  Abdominal: Soft.       Somewhat diminished bowel sounds throughout. He has some mild diffuse tenderness right upper quadrant epigastric and bilateral lower abdominal. No localizing tenderness. No guarding or rebound.  Neurological: He is alert and oriented to person, place, and time.          Assessment & Plan:  Patient recently diagnosed C. difficile colitis. Initial possible allergic reaction to Flagyl. Subsequently switched to vancomycin. Now has nausea and vomiting of one-day duration. Rule out ileus. Obtain further labs - CBC, hepatic panel, amylase, and basic metabolic panel. Zofran 8 mg every 8 hours as needed for nausea and vomiting. Plain films abdomen. Has appointment gastroenterologist 3 days we'll see if this can be moved up. May need IV fluids and more intensive evaluation if symptoms not improving soon  Spoke with patient.  Labs all OK.  No further vomiting.  Still have fairly severe diarrhea.  He requests referral to [facility] and will try to arrange as soon as possible.  He has been treated with approx one week Flagyl and now one week oral vancomycin but still having 6+ diarrhea stools per day.

## 2011-09-08 ENCOUNTER — Ambulatory Visit (INDEPENDENT_AMBULATORY_CARE_PROVIDER_SITE_OTHER): Payer: Self-pay | Admitting: Physician Assistant

## 2011-09-08 ENCOUNTER — Encounter: Payer: Self-pay | Admitting: Physician Assistant

## 2011-09-08 ENCOUNTER — Telehealth: Payer: Self-pay | Admitting: Internal Medicine

## 2011-09-08 ENCOUNTER — Ambulatory Visit: Payer: Self-pay | Admitting: Physician Assistant

## 2011-09-08 VITALS — BP 134/82 | HR 80 | Ht 71.0 in | Wt 235.0 lb

## 2011-09-08 DIAGNOSIS — A0472 Enterocolitis due to Clostridium difficile, not specified as recurrent: Secondary | ICD-10-CM

## 2011-09-08 DIAGNOSIS — Z8619 Personal history of other infectious and parasitic diseases: Secondary | ICD-10-CM

## 2011-09-08 MED ORDER — SACCHAROMYCES BOULARDII 250 MG PO CAPS: 250.0000 mg | ORAL_CAPSULE | Freq: Two times a day (BID) | ORAL | Status: AC

## 2011-09-08 MED ORDER — DICYCLOMINE HCL 10 MG PO CAPS: ORAL_CAPSULE | ORAL | Status: DC

## 2011-09-08 MED ORDER — VANCOMYCIN HCL 250 MG PO CAPS: ORAL_CAPSULE | ORAL | Status: DC

## 2011-09-08 NOTE — Patient Instructions (Signed)
We sent prescriptions to [facility]. 1. Vancomycin 2. Bentyl ( Dicyclomine 10 mg ) 3. Florstor  Call us if your symptoms worsen. Eat a bland diet. Push Fluids.  See Amy Esterwood PA-C in 2 weeks.

## 2011-09-08 NOTE — Telephone Encounter (Signed)
Per Dr. Caryl Never pt needs to be seen asap for diarrhea/c-diff/Nausea. Pt scheduled to see Mike Gip PA today at 2pm. Aurther Loft to notify pt of appt date and time.

## 2011-09-08 NOTE — Progress Notes (Signed)
Subjective:    Patient ID: Eric [name], male    DOB: [birth year], 61 y.o.   MRN: [id number]  HPI [name] is a 61 year old male ,new to GI today referred by Dr. Caryl Never. Patient reports that he did have a colonoscopy by Dr. Loreta Ave about 4 years ago. He reports history of C. difficile colitis about 10 years ago after he had taken a course of Cipro. Patient had onset February 1 with severe diarrhea and says at that point he was having up to 30 watery bowel movements per day with urgency and abdominal cramping. He was seen by primary care had stool cultures done and is positive for C. difficile. He was initially treated with a course of Flagyl x10 days, however after about 9 days of Flagyl he developed a rash all over his body and stop the Flagyl. He went back to Dr. Caryl Never and was placed on a 7 day course of vancomycin. He says he finished vancomycin 2 days ago. That point he was still having some problems with nausea, low back pain and having off or 7 bowel movements per day of of liquid watery stool. Yesterday he had some nausea and vomiting and increase in abdominal cramping she says was a 6-7/10 in intensity. As well is continues with diarrhea. He has not had any fever or chills but generally does not feel well. Total yesterday he had 7 bowel movements and less today. He has been trying to work.    Review of Systems  Constitutional: Positive for appetite change.  HENT: Negative.   Eyes: Negative.   Respiratory: Negative.   Cardiovascular: Negative.   Gastrointestinal: Positive for nausea, abdominal pain and diarrhea.  Genitourinary: Negative.   Musculoskeletal: Negative.   Neurological: Negative.   Hematological: Negative.   Psychiatric/Behavioral: Negative.    Outpatient Prescriptions Prior to Visit  Medication Sig Dispense Refill  . cetirizine (ZYRTEC) 10 MG tablet Take 10 mg by mouth daily.      . ondansetron (ZOFRAN ODT) 8 MG disintegrating tablet Take 1 tablet (8 mg total) by  mouth every 8 (eight) hours as needed for nausea.  20 tablet  0  . sertraline (ZOLOFT) 100 MG tablet Take 1 tablet (100 mg total) by mouth daily.  90 tablet  3  . metroNIDAZOLE (FLAGYL) 500 MG tablet            Allergies  Allergen Reactions  . Ciprofloxacin     REACTION: GI upset, C-diff  . Flagyl (Metronidazole Hcl) Rash   Active Ambulatory Problems    Diagnosis Date Noted  . DEPRESSION 12/25/2008  . LIBIDO, DECREASED 12/25/2008  . SKIN CANCER, HX OF 12/25/2008  . Hx of Clostridium difficile infection 09/08/2011   Resolved Ambulatory Problems    Diagnosis Date Noted  . No Resolved Ambulatory Problems   Past Medical History  Diagnosis Date  . Chicken pox   . Dyslipidemia   . Anal fissure     Objective:   Physical Exam Well-developed white male in no acute distress, blood pressure 134/82 pulse 80. HEENT ;nontraumatic, normocephalic, EOMI ,PERRLA sclera anicteric, Neck;Supple no JVD, Cardiovascular, regular rate and rhythm with S1-S2 no murmur rub or gallop, Pulmonary ,clear bilaterally, Abdomen, soft, bowel sounds hyperactive, is mildly tender in the left mid quadrant left lower quadrant, no guarding, no rebound ,no palpable mass or hepatosplenomegaly, Rectal ;exam not done, Extremities; no clubbing cyanosis or edema, skin benign no rash or evident currently, Psych; mood and affect appropriate.  Assessment & Plan:  #34 61 year old male with refractory C. difficile colitis Appears to be resistant to Flagyl and partially treated with a one-week course of vancomycin. He is nontoxic ,appearing however continues with abdominal cramping and moderate diarrhea.  Plan restart vancomycin 250 mg 4 times daily for 2 more weeks Add florastor one- twice daily x3 weeks Had been still 10 mg 2-3 times daily as needed for cramping Patient is using Zofran when necessary or nausea Continue bland diet and liberal fluid intake. Patient was advised to call at any point should he feel that  his symptoms are worse. Plan office followup in 2 weeks. Will also need to obtain a copy of his prior colonoscopy report.

## 2011-09-08 NOTE — Progress Notes (Signed)
Addended by: Kristian Covey on: 09/08/2011 07:56 AM   Modules accepted: Orders

## 2011-09-09 NOTE — Progress Notes (Signed)
Agree with Ms. Esterwood's assessment and plan. Jerri Hargadon E. Azelie Noguera, MD, FACG   

## 2011-09-10 ENCOUNTER — Telehealth: Payer: Self-pay | Admitting: *Deleted

## 2011-09-10 NOTE — Telephone Encounter (Signed)
Please call pt- I saw him yesterday for persistent cdiff- just let him know We got a copy of his colonoscopy report from Dr. Loreta Ave. He did have adenomatous polyp and should have a follow up colonoscopy at 5 years which will be 2014- we are happy to do it here.     Spoke with patient and he would like the recall colonoscopy to be done here. Placed on recall.

## 2011-09-24 ENCOUNTER — Telehealth: Payer: Self-pay | Admitting: Internal Medicine

## 2011-09-24 ENCOUNTER — Other Ambulatory Visit: Payer: 59

## 2011-09-24 DIAGNOSIS — R197 Diarrhea, unspecified: Secondary | ICD-10-CM

## 2011-09-24 NOTE — Telephone Encounter (Signed)
Ok, sounds good.

## 2011-09-24 NOTE — Telephone Encounter (Signed)
Patient was seen on 09/08/11 for c-diff, he was continued on Vancomycin 250 mg qid for 2 weeks, florastor, and bentyl.  No further cramping or nausea.  He reports he is still having 4 loose stools a day.  He will take his last dose of vanc today.  Previously discussed with Mike Gip PA he will come for cdiff by PCR .  He will come today.  He is advised that we will call him with results and next step once we have the results.

## 2011-09-29 ENCOUNTER — Encounter: Payer: Self-pay | Admitting: Internal Medicine

## 2011-09-29 ENCOUNTER — Ambulatory Visit (INDEPENDENT_AMBULATORY_CARE_PROVIDER_SITE_OTHER): Payer: 59 | Admitting: Internal Medicine

## 2011-09-29 VITALS — BP 120/80 | HR 72 | Ht 71.0 in | Wt 234.0 lb

## 2011-09-29 DIAGNOSIS — R197 Diarrhea, unspecified: Secondary | ICD-10-CM

## 2011-09-29 DIAGNOSIS — A0472 Enterocolitis due to Clostridium difficile, not specified as recurrent: Secondary | ICD-10-CM

## 2011-09-29 DIAGNOSIS — R1032 Left lower quadrant pain: Secondary | ICD-10-CM

## 2011-09-29 NOTE — Progress Notes (Signed)
  Subjective:    Patient ID: Eric [name], male    DOB: February 28, 1951, 61 y.o.   MRN: [id number]  HPI This is a very pleasant 61 year old married white man, here with his wife for followup after Clostridium difficile infection. He developed severe profuse watery diarrhea in early February. He tested positive for the C. difficile toxin. He was treated with metronidazole and was improving but developed a rash. He was switched to vancomycin and took a course but had persistent symptoms and was given a two-week treatment course. That was completed approximately one week ago. He was still having some loose stools, a Clostridium difficile PCR test returned negative on March 7. He has had lingering diarrhea and low-grade abdominal pain and cramps. He is taking one Imodium daily and dicyclomine as needed and is having 2 or 3 stools a day at this time though they're still loose. He is not having profuse watery diarrhea and he is not having nocturnal stools or fecal incontinence. He is preparing to travel to [place] in about 10-12 days. He wants to be fairly certain that this problem is resolved.  He had not been on antibiotics prior to this that we are aware of there was also intranasal antifungal agents related to sinus surgery.  Past medical history, surgical history, medications and allergies are reviewed and updated in the electronic medical record.  Review of Systems As above    Objective:   Physical Exam Developed well-nourished no acute distress Eyes anicteric The abdomen is somewhat obese, soft with mild left lower quadrant tenderness, bowel sounds present.       Assessment & Plan:   1. Intestinal infection due to Clostridium difficile   2. Diarrhea   3. Abdominal pain, left lower quadrant     He is improved overall. He is concerned that he cannot get back to normal. I am reassured by the negative PCR test for C. difficile. 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  Transverse definitely towards improvement though not resolution of diarrhea. I discussed this with the patient and his wife, I explained that there is still possibility for recurrence but I could not have a good evidence of that at this time. Have advised him to try a low fiber diet and use the Imodium. He is to call back in 6 days, if he is not satisfactorily improved, in light of the fact that he is preparing a trip to [place], leaving at the end of next week.  If he deteriorates, he could need a sigmoidoscopy or colonoscopy to better sort things out, versus retreating empirically. A negative stool tests for C. difficile by PCR is supposed be very accurate though no test is perfect.  Carbon copy WU:[email or web address],GNFAO W, MD, MD

## 2011-09-29 NOTE — Patient Instructions (Signed)
We have given you a Low Fiber/Low Residue Diet handout today. We think you should continue to improve, but call back early next week if symptoms do not resolve.

## 2011-10-26 ENCOUNTER — Ambulatory Visit: Payer: 59 | Admitting: Internal Medicine

## 2012-06-19 ENCOUNTER — Encounter: Payer: Self-pay | Admitting: Internal Medicine

## 2012-06-19 DIAGNOSIS — Z860100 Personal history of colon polyps, unspecified: Secondary | ICD-10-CM | POA: Insufficient documentation

## 2012-06-21 ENCOUNTER — Encounter: Payer: Self-pay | Admitting: Internal Medicine

## 2013-03-17 ENCOUNTER — Encounter: Payer: Self-pay | Admitting: Internal Medicine

## 2013-03-23 ENCOUNTER — Encounter: Payer: Self-pay | Admitting: Internal Medicine

## 2013-11-03 ENCOUNTER — Encounter: Payer: Self-pay | Admitting: Internal Medicine

## 2016-09-09 ENCOUNTER — Telehealth: Payer: Self-pay | Admitting: Family Medicine

## 2016-09-09 ENCOUNTER — Encounter: Payer: Self-pay | Admitting: Family Medicine

## 2016-09-09 ENCOUNTER — Ambulatory Visit (INDEPENDENT_AMBULATORY_CARE_PROVIDER_SITE_OTHER): Payer: 59 | Admitting: Family Medicine

## 2016-09-09 VITALS — BP 110/80 | HR 82 | Ht 71.0 in | Wt 203.6 lb

## 2016-09-09 DIAGNOSIS — F4320 Adjustment disorder, unspecified: Secondary | ICD-10-CM

## 2016-09-09 DIAGNOSIS — G4700 Insomnia, unspecified: Secondary | ICD-10-CM | POA: Diagnosis not present

## 2016-09-09 DIAGNOSIS — L02415 Cutaneous abscess of right lower limb: Secondary | ICD-10-CM

## 2016-09-09 MED ORDER — ZOLPIDEM TARTRATE 10 MG PO TABS: 10.0000 mg | ORAL_TABLET | Freq: Every evening | ORAL | 0 refills | Status: DC | PRN

## 2016-09-09 NOTE — Progress Notes (Signed)
Subjective:     Patient ID: Eric [name], male   DOB: [birth year], 65 y.o.   MRN: [id number]  HPI Patient is coming back to reestablish care. His been working up in [place] for the past couple of years. He just returned back in the past month. He has several issues to address as follows  Increased situational stress. He states his wife left him February 2 after being married 59 years. She is asking for divorce. This has obviously been very stressful. He's had tremendous difficulty getting sleep. He has some leftover Ambien which he used. He has not yet had any counseling. Denies any depression or suicidal ideation. He's had some increased anxiety symptoms somewhat like panic attack intermittently.  Painful "cyst" right upper inner groin. Intermittent for the past month and half. Went to urgent care earlier today and was prescribed Keflex. Has not had any drainage. This is moderately tender.  History of nasal polyps which have been controlled with Flonase.  Past Medical History:  Diagnosis Date  . Anal fissure   . Chicken pox   . DEPRESSION 12/25/2008  . Dyslipidemia   . LIBIDO, DECREASED 12/25/2008  . SKIN CANCER, HX OF 12/25/2008   Past Surgical History:  Procedure Laterality Date  . KNEE ARTHROSCOPY  1994, 1996   right, left  . NASAL POLYP SURGERY  2004, 2009, 2012   x 4  . SHOULDER ARTHROSCOPY W/ ROTATOR CUFF REPAIR  2005  . TONSILLECTOMY      reports that he quit smoking about 9 years ago. His smoking use included Cigarettes. He has never used smokeless tobacco. He reports that he drinks alcohol. He reports that he does not use drugs. family history includes Alzheimer's disease (age of onset: 39) in his father; Cancer in his father; Multiple sclerosis in his brother. Allergies  Allergen Reactions  . Ciprofloxacin     REACTION: GI upset, C-diff  . Flagyl [Metronidazole Hcl] Rash     Review of Systems  Constitutional: Negative for chills and fever.  Respiratory: Negative  for shortness of breath.   Cardiovascular: Negative for chest pain.  Psychiatric/Behavioral: Positive for sleep disturbance. Negative for suicidal ideas. The patient is nervous/anxious.        Objective:   Physical Exam  Constitutional: He appears well-developed and well-nourished.  Cardiovascular: Normal rate and regular rhythm.   Pulmonary/Chest: Effort normal and breath sounds normal. No respiratory distress. He has no wheezes. He has no rales.  Skin:  Right upper inner thigh reveals area of erythema approximately 3-3 cm. The center there is a area fluctuance about 1 cm diameter. Tender to palpation       Assessment:     #1 abscess right inner thigh  #2 situational stress with anxiety and insomnia    Plan:     -Recommend incision drainage of right thigh abscess and pt consented Skin prepped with betadine.  Anesthesia with 1% plain xylocaine.  1 cm incision with #11 blade.  Some pus expressed.  Hemostats used to explore wound cavity.  Packing with iodoform gauze.  Minimal bleeding.  Bandage applied. -Recommend he consider counseling with numbers and names of counselors given -set up CPE.  [name] MD [facility]

## 2016-09-09 NOTE — Patient Instructions (Signed)
Incision and Drainage Incision and drainage is a surgical procedure to open and drain a fluid-filled sac. The sac may be filled with pus, mucus, or blood. Examples of fluid-filled sacs that may need surgical drainage include cysts, skin infections (abscesses), and red lumps that develop from a ruptured cyst or a small abscess (boils). You may need this procedure if the affected area is large, painful, infected, or not healing well. Tell a health care provider about:  Any allergies you have.  All medicines you are taking, including vitamins, herbs, eye drops, creams, and over-the-counter medicines.  Any problems you or family members have had with anesthetic medicines.  Any blood disorders you have.  Any surgeries you have had.  Any medical conditions you have.  Whether you are pregnant or may be pregnant. What are the risks? Generally, this is a safe procedure. However, problems may occur, including:  Infection.  Bleeding.  Allergic reactions to medicines.  Scarring.  What happens before the procedure?  You may need an ultrasound or other imaging tests to see how large or deep the fluid-filled sac is.  You may have blood tests to check for infection.  You may get a tetanus shot.  You may be given antibiotic medicine to help prevent infection.  Follow instructions from your health care provider about eating or drinking restrictions.  Ask your health care provider about: ? Changing or stopping your regular medicines. This is especially important if you are taking diabetes medicines or blood thinners. ? Taking medicines such as aspirin and ibuprofen. These medicines can thin your blood. Do not take these medicines before your procedure if your health care provider instructs you not to.  Plan to have someone take you home after the procedure.  If you will be going home right after the procedure, plan to have someone stay with you for 24 hours. What happens during the  procedure?  To reduce your risk of infection: ? Your health care team will wash or sanitize their hands. ? Your skin will be washed with soap.  You will be given one or more of the following: ? A medicine to help you relax (sedative). ? A medicine to numb the area (local anesthetic). ? A medicine to make you fall asleep (general anesthetic).  An incision will be made in the top of the fluid-filled sac.  The contents of the sac may be squeezed out, or a syringe or tube (catheter)may be used to empty the sac.  The catheter may be left in place for several weeks to drain any fluid. Or, your health care provider may stitch open the edges of the incision to make a long-term opening for drainage (marsupialization).  The inside of the sac may be washed out (irrigated) with a sterile solution and packed with gauze before it is covered with a bandage (dressing). The procedure may vary among health care providers and hospitals. What happens after the procedure?  Your blood pressure, heart rate, breathing rate, and blood oxygen level will be monitored often until the medicines you were given have worn off.  Do not drive for 24 hours if you received a sedative. This information is not intended to replace advice given to you by your health care provider. Make sure you discuss any questions you have with your health care provider. Document Released: 12/30/2000 Document Revised: 12/12/2015 Document Reviewed: 04/26/2015 Elsevier Interactive Patient Education  2017 Elsevier Inc.  

## 2016-09-09 NOTE — Telephone Encounter (Signed)
OK 

## 2016-09-09 NOTE — Telephone Encounter (Signed)
Pt has been sch

## 2016-09-09 NOTE — Telephone Encounter (Signed)
Pt was last seen on 09-07-2011 and would like to re-est. Can I schedule

## 2016-09-09 NOTE — Progress Notes (Signed)
Pre visit review using our clinic review tool, if applicable. No additional management support is needed unless otherwise documented below in the visit note. 

## 2016-09-10 ENCOUNTER — Other Ambulatory Visit: Payer: Self-pay

## 2016-09-10 ENCOUNTER — Telehealth: Payer: Self-pay | Admitting: Family Medicine

## 2016-09-10 DIAGNOSIS — Z113 Encounter for screening for infections with a predominantly sexual mode of transmission: Secondary | ICD-10-CM

## 2016-09-10 NOTE — Telephone Encounter (Signed)
Pt would like to add additional labs to his cpx labs tomorrow, 09/11/2016. 1.  Hep c screening 2. STD's 3. H I V screening   Is that ok?

## 2016-09-11 ENCOUNTER — Other Ambulatory Visit (INDEPENDENT_AMBULATORY_CARE_PROVIDER_SITE_OTHER): Payer: 59

## 2016-09-11 ENCOUNTER — Other Ambulatory Visit (HOSPITAL_COMMUNITY)
Admission: RE | Admit: 2016-09-11 | Discharge: 2016-09-11 | Disposition: A | Discharge status: Home / Self Care | Payer: 59 | Source: Ambulatory Visit | Attending: Family Medicine | Admitting: Family Medicine

## 2016-09-11 DIAGNOSIS — Z113 Encounter for screening for infections with a predominantly sexual mode of transmission: Secondary | ICD-10-CM | POA: Insufficient documentation

## 2016-09-11 DIAGNOSIS — Z0000 Encounter for general adult medical examination without abnormal findings: Secondary | ICD-10-CM | POA: Diagnosis not present

## 2016-09-11 LAB — CBC WITH DIFFERENTIAL/PLATELET
Basophils Absolute: 0.1 10*3/uL (ref 0.0–0.1)
Basophils Relative: 1.2 % (ref 0.0–3.0)
Eosinophils Absolute: 0.2 10*3/uL (ref 0.0–0.7)
Eosinophils Relative: 5 % (ref 0.0–5.0)
HEMATOCRIT: 43.4 % (ref 39.0–52.0)
HEMOGLOBIN: 14.6 g/dL (ref 13.0–17.0)
LYMPHS PCT: 19.9 % (ref 12.0–46.0)
Lymphs Abs: 1 10*3/uL (ref 0.7–4.0)
MCHC: 33.7 g/dL (ref 30.0–36.0)
MCV: 91.5 fl (ref 78.0–100.0)
MONOS PCT: 10.5 % (ref 3.0–12.0)
Monocytes Absolute: 0.5 10*3/uL (ref 0.1–1.0)
NEUTROS ABS: 3 10*3/uL (ref 1.4–7.7)
Neutrophils Relative %: 63.4 % (ref 43.0–77.0)
PLATELETS: 219 10*3/uL (ref 150.0–400.0)
RBC: 4.74 Mil/uL (ref 4.22–5.81)
RDW: 13.3 % (ref 11.5–15.5)
WBC: 4.8 10*3/uL (ref 4.0–10.5)

## 2016-09-11 LAB — BASIC METABOLIC PANEL
BUN: 16 mg/dL (ref 6–23)
CALCIUM: 9 mg/dL (ref 8.4–10.5)
CO2: 25 mEq/L (ref 19–32)
Chloride: 104 mEq/L (ref 96–112)
Creatinine, Ser: 0.75 mg/dL (ref 0.40–1.50)
GFR: 111.58 mL/min (ref >60.00)
GLUCOSE: 114 mg/dL — AB (ref 70–99)
Potassium: 3.8 mEq/L (ref 3.5–5.1)
SODIUM: 138 meq/L (ref 135–145)

## 2016-09-11 LAB — HEPATIC FUNCTION PANEL
ALBUMIN: 4.1 g/dL (ref 3.5–5.2)
ALK PHOS: 73 U/L (ref 39–117)
ALT: 15 U/L (ref 0–53)
AST: 19 U/L (ref 0–37)
Bilirubin, Direct: 0.2 mg/dL (ref 0.0–0.3)
TOTAL PROTEIN: 6.8 g/dL (ref 6.0–8.3)
Total Bilirubin: 1.1 mg/dL (ref 0.2–1.2)

## 2016-09-11 LAB — LIPID PANEL
CHOL/HDL RATIO: 3
Cholesterol: 208 mg/dL — ABNORMAL HIGH (ref 0–200)
HDL: 66.4 mg/dL (ref >39.00)
LDL Cholesterol: 119 mg/dL — ABNORMAL HIGH (ref 0–99)
NONHDL: 141.17
Triglycerides: 113 mg/dL (ref 0.0–149.0)
VLDL: 22.6 mg/dL (ref 0.0–40.0)

## 2016-09-11 LAB — PSA: PSA: 0.33 ng/mL (ref 0.10–4.00)

## 2016-09-11 LAB — TSH: TSH: 1.14 u[IU]/mL (ref 0.35–4.50)

## 2016-09-11 NOTE — Telephone Encounter (Signed)
OK to add HIV, RPR, GC/chlamydia, and Hep C antibody.

## 2016-09-11 NOTE — Telephone Encounter (Signed)
Orders entered

## 2016-09-12 LAB — HIV ANTIBODY (ROUTINE TESTING W REFLEX): HIV 1&2 Ab, 4th Generation: NONREACTIVE

## 2016-09-12 LAB — RPR

## 2016-09-12 LAB — HEPATITIS C ANTIBODY: HCV Ab: NEGATIVE

## 2016-09-14 ENCOUNTER — Ambulatory Visit (INDEPENDENT_AMBULATORY_CARE_PROVIDER_SITE_OTHER): Payer: 59 | Admitting: Family Medicine

## 2016-09-14 ENCOUNTER — Encounter: Payer: Self-pay | Admitting: Family Medicine

## 2016-09-14 VITALS — BP 122/80 | HR 87 | Ht 71.0 in | Wt 205.0 lb

## 2016-09-14 DIAGNOSIS — Z0000 Encounter for general adult medical examination without abnormal findings: Secondary | ICD-10-CM

## 2016-09-14 LAB — URINE CYTOLOGY ANCILLARY ONLY
CHLAMYDIA, DNA PROBE: NEGATIVE
Neisseria Gonorrhea: NEGATIVE

## 2016-09-14 NOTE — Patient Instructions (Signed)
Hyperglycemia  Hyperglycemia occurs when the level of sugar (glucose) in the blood is too high. Glucose is a type of sugar that provides the body's main source of energy. Certain hormones (insulin and glucagon) control the level of glucose in the blood. Insulin lowers blood glucose, and glucagon increases blood glucose. Hyperglycemia can result from having too little insulin in the bloodstream, or from the body not responding normally to insulin.  Hyperglycemia occurs most often in people who have diabetes (diabetes mellitus), but it can happen in people who do not have diabetes. It can develop quickly, and it can be life-threatening if it causes you to become severely dehydrated (diabetic ketoacidosis or hyperglycemic hyperosmolar state). Severe hyperglycemia is a medical emergency.  What are the causes?  If you have diabetes, hyperglycemia may be caused by:  · Diabetes medicine.  · Medicines that increase blood glucose or affect your diabetes control.  · Not eating enough, or not eating often enough.  · Changes in physical activity level.  · Being sick or having an infection.    If you have prediabetes or undiagnosed diabetes:  · Hyperglycemia may be caused by those conditions.    If you do not have diabetes, hyperglycemia may be caused by:  · Certain medicines, including steroid medicines, beta-blockers, epinephrine, and thiazide diuretics.  · Stress.  · Serious illness.  · Surgery.  · Diseases of the pancreas.  · Infection.    What increases the risk?  Hyperglycemia is more likely to develop in people who have risk factors for diabetes, such as:  · Having a family member with diabetes.  · Having a gene for type 1 diabetes that is passed from parent to child (inherited).  · Living in an area with cold weather conditions.  · Exposure to certain viruses.  · Certain conditions in which the body's disease-fighting (immune) system attacks itself (autoimmune disorders).  · Being overweight or obese.  · Having an  inactive (sedentary) lifestyle.  · Having been diagnosed with insulin resistance.  · Having a history of prediabetes, gestational diabetes, or polycystic ovarian syndrome (PCOS).  · Being of American-Indian, African-American, Hispanic/Latino, or Asian/Pacific Islander descent.    What are the signs or symptoms?  Hyperglycemia may not cause any symptoms. If you do have symptoms, they may include early warning signs, such as:  · Increased thirst.  · Hunger.  · Feeling very tired.  · Needing to urinate more often than usual.  · Blurry vision.    Other symptoms may develop if hyperglycemia gets worse, such as:  · Dry mouth.  · Loss of appetite.  · Fruity-smelling breath.  · Weakness.  · Unexpected or rapid weight gain or weight loss.  · Tingling or numbness in the hands or feet.  · Headache.  · Skin that does not quickly return to normal after being lightly pinched and released (poor skin turgor).  · Abdominal pain.  · Cuts or bruises that are slow to heal.    How is this diagnosed?  Hyperglycemia is diagnosed with a blood test to measure your blood glucose level. This blood test is usually done while you are having symptoms. Your health care provider may also do a physical exam and review your medical history.  You may have more tests to determine the cause of your hyperglycemia, such as:  · A fasting blood glucose (FBG) test. You will not be allowed to eat (you will fast) for at least 8 hours before a blood sample is   taken.  · An A1c (hemoglobin A1c) blood test. This provides information about blood glucose control over the previous 2-3 months.  · An oral glucose tolerance test (OGTT). This measures your blood glucose at two times:  ? After fasting. This is your baseline blood glucose level.  ? Two hours after drinking a beverage that contains glucose.    How is this treated?  Treatment depends on the cause of your hyperglycemia. Treatment may include:  · Taking medicine to regulate your blood glucose levels. If you  take insulin or other diabetes medicines, your medicine or dosage may be adjusted.  · Lifestyle changes, such as exercising more, eating healthier foods, or losing weight.  · Treating an illness or infection, if this caused your hyperglycemia.  · Checking your blood glucose more often.  · Stopping or reducing steroid medicines, if these caused your hyperglycemia.    If your hyperglycemia becomes severe and it results in hyperglycemic hyperosmolar state, you must be hospitalized and given IV fluids.  Follow these instructions at home:  General instructions   · Take over-the-counter and prescription medicines only as told by your health care provider.  · Do not use any products that contain nicotine or tobacco, such as cigarettes and e-cigarettes. If you need help quitting, ask your health care provider.  · Limit alcohol intake to no more than 1 drink per day for nonpregnant women and 2 drinks per day for men. One drink equals 12 oz of beer, 5 oz of wine, or 1½ oz of hard liquor.  · Learn to manage stress. If you need help with this, ask your health care provider.  · Keep all follow-up visits as told by your health care provider. This is important.  Eating and drinking   · Maintain a healthy weight.  · Exercise regularly, as directed by your health care provider.  · Stay hydrated, especially when you exercise, get sick, or spend time in hot temperatures.  · Eat healthy foods, such as:  ? Lean proteins.  ? Complex carbohydrates.  ? Fresh fruits and vegetables.  ? Low-fat dairy products.  ? Healthy fats.  · Drink enough fluid to keep your urine clear or pale yellow.  If you have diabetes:     · Make sure you know the symptoms of hyperglycemia.  · Follow your diabetes management plan, as told by your health care provider. Make sure you:  ? Take your insulin and medicines as directed.  ? Follow your exercise plan.  ? Follow your meal plan. Eat on time, and do not skip meals.  ? Check your blood glucose as often as  directed. Make sure to check your blood glucose before and after exercise. If you exercise longer or in a different way than usual, check your blood glucose more often.  ? Follow your sick day plan whenever you cannot eat or drink normally. Make this plan in advance with your health care provider.  · Share your diabetes management plan with people in your workplace, school, and household.  · Check your urine for ketones when you are ill and as told by your health care provider.  · Carry a medical alert card or wear medical alert jewelry.  Contact a health care provider if:  · Your blood glucose is at or above 240 mg/dL (13.3 mmol/L) for 2 days in a row.  · You have problems keeping your blood glucose in your target range.  · You have frequent episodes of hyperglycemia.    Get help right away if:  · You have difficulty breathing.  · You have a change in how you think, feel, or act (mental status).  · You have nausea or vomiting that does not go away.  These symptoms may represent a serious problem that is an emergency. Do not wait to see if the symptoms will go away. Get medical help right away. Call your local emergency services (911 in the [place] Do not drive yourself to the hospital.  Summary  · Hyperglycemia occurs when the level of sugar (glucose) in the blood is too high.  · Hyperglycemia is diagnosed with a blood test to measure your blood glucose level. This blood test is usually done while you are having symptoms. Your health care provider may also do a physical exam and review your medical history.  · If you have diabetes, follow your diabetes management plan as told by your health care provider.  · Contact your health care provider if you have problems keeping your blood glucose in your target range.  This information is not intended to replace advice given to you by your health care provider. Make sure you discuss any questions you have with your health care provider.  Document Released: 12/30/2000 Document  Revised: 03/23/2016 Document Reviewed: 03/23/2016  Elsevier Interactive Patient Education © 2017 Elsevier Inc.

## 2016-09-14 NOTE — Progress Notes (Signed)
Pre visit review using our clinic review tool, if applicable. No additional management support is needed unless otherwise documented below in the visit note. 

## 2016-09-14 NOTE — Progress Notes (Signed)
Subjective:     Patient ID: Eric [name], male   DOB: 03/03/1951, 66 y.o.   MRN: [id number]  HPI Patient seen for complete physical. He was here just last week to re-establish care. He had right thigh abscess which we drained with incision. This is doing much better. He changed the dressing the following day and packing came out. He has not had any redness or tenderness.  Recently moved back to this area after being in [place] for a few years. Recent separation from his wife of 33 years. He was given names of counselors. He got some good sleep over the weekend and feels much better overall. Denies any depressive symptoms. Tetanus up-to-date. Colonoscopy up-to-date.  Past Medical History:  Diagnosis Date  . Anal fissure   . Chicken pox   . DEPRESSION 12/25/2008  . Dyslipidemia   . LIBIDO, DECREASED 12/25/2008  . SKIN CANCER, HX OF 12/25/2008   Past Surgical History:  Procedure Laterality Date  . KNEE ARTHROSCOPY  1994, 1996   right, left  . NASAL POLYP SURGERY  2004, 2009, 2012   x 4  . SHOULDER ARTHROSCOPY W/ ROTATOR CUFF REPAIR  2005  . TONSILLECTOMY      reports that he quit smoking about 9 years ago. His smoking use included Cigarettes. He has never used smokeless tobacco. He reports that he drinks alcohol. He reports that he does not use drugs. family history includes Alzheimer's disease (age of onset: 37) in his father; Cancer in his father; Multiple sclerosis in his brother. Allergies  Allergen Reactions  . Ciprofloxacin     REACTION: GI upset, C-diff  . Flagyl [Metronidazole Hcl] Rash     Review of Systems  Constitutional: Negative for activity change, appetite change, fatigue, fever and unexpected weight change.  HENT: Negative for congestion, ear pain and trouble swallowing.   Eyes: Negative for pain and visual disturbance.  Respiratory: Negative for cough, shortness of breath and wheezing.   Cardiovascular: Negative for chest pain and palpitations.   Gastrointestinal: Negative for abdominal distention, abdominal pain, blood in stool, constipation, diarrhea, nausea, rectal pain and vomiting.  Endocrine: Negative for polydipsia and polyuria.  Genitourinary: Negative for dysuria, hematuria and testicular pain.  Musculoskeletal: Negative for arthralgias and joint swelling.  Skin: Negative for rash.  Neurological: Negative for dizziness, syncope and headaches.  Hematological: Negative for adenopathy.  Psychiatric/Behavioral: Negative for confusion and dysphoric mood.       Objective:   Physical Exam  Constitutional: He is oriented to person, place, and time. He appears well-developed and well-nourished. No distress.  HENT:  Head: Normocephalic and atraumatic.  Right Ear: External ear normal.  Left Ear: External ear normal.  Mouth/Throat: Oropharynx is clear and moist.  Eyes: Conjunctivae and EOM are normal. Pupils are equal, round, and reactive to light.  Neck: Normal range of motion. Neck supple. No thyromegaly present.  Cardiovascular: Normal rate, regular rhythm and normal heart sounds.   No murmur heard. Pulmonary/Chest: No respiratory distress. He has no wheezes. He has no rales.  Abdominal: Soft. Bowel sounds are normal. He exhibits no distension and no mass. There is no tenderness. There is no rebound and no guarding.  Musculoskeletal: He exhibits no edema.  Lymphadenopathy:    He has no cervical adenopathy.  Neurological: He is alert and oriented to person, place, and time. He displays normal reflexes. No cranial nerve deficit.  Skin: No rash noted.  Psychiatric: He has a normal mood and affect.  Assessment:     Physical exam-labs reviewed. Patient requested STD screening with HIV, hepatitis C antibody, and RPR these were all negative.  He has hyperglycemia with glucose in the prediabetes range    Plan:     -continue regular exercise habits and weight loss efforts -We have challenged him to try to get his weight  down below 190 pounds -Consider follow-up fasting glucose within 6 months and consider baseline A1c at follow-up  [name] MD [facility]

## 2018-01-17 DIAGNOSIS — L570 Actinic keratosis: Secondary | ICD-10-CM | POA: Diagnosis not present

## 2018-01-17 DIAGNOSIS — D485 Neoplasm of uncertain behavior of skin: Secondary | ICD-10-CM | POA: Diagnosis not present

## 2018-01-17 DIAGNOSIS — L820 Inflamed seborrheic keratosis: Secondary | ICD-10-CM | POA: Diagnosis not present

## 2018-01-24 DIAGNOSIS — M25562 Pain in left knee: Secondary | ICD-10-CM | POA: Diagnosis not present

## 2018-01-24 DIAGNOSIS — M25552 Pain in left hip: Secondary | ICD-10-CM | POA: Diagnosis not present

## 2018-01-27 DIAGNOSIS — M7062 Trochanteric bursitis, left hip: Secondary | ICD-10-CM | POA: Diagnosis not present

## 2018-02-14 DIAGNOSIS — L03012 Cellulitis of left finger: Secondary | ICD-10-CM | POA: Diagnosis not present

## 2018-04-14 DIAGNOSIS — M6281 Muscle weakness (generalized): Secondary | ICD-10-CM | POA: Diagnosis not present

## 2018-04-14 DIAGNOSIS — M25562 Pain in left knee: Secondary | ICD-10-CM | POA: Diagnosis not present

## 2018-04-14 DIAGNOSIS — M7062 Trochanteric bursitis, left hip: Secondary | ICD-10-CM | POA: Diagnosis not present

## 2018-04-14 DIAGNOSIS — M1712 Unilateral primary osteoarthritis, left knee: Secondary | ICD-10-CM | POA: Diagnosis not present

## 2018-04-14 DIAGNOSIS — M25552 Pain in left hip: Secondary | ICD-10-CM | POA: Diagnosis not present

## 2018-04-17 DIAGNOSIS — Z23 Encounter for immunization: Secondary | ICD-10-CM | POA: Diagnosis not present

## 2018-04-19 DIAGNOSIS — M25552 Pain in left hip: Secondary | ICD-10-CM | POA: Diagnosis not present

## 2018-04-19 DIAGNOSIS — M25562 Pain in left knee: Secondary | ICD-10-CM | POA: Diagnosis not present

## 2018-04-19 DIAGNOSIS — M6281 Muscle weakness (generalized): Secondary | ICD-10-CM | POA: Diagnosis not present

## 2018-04-21 DIAGNOSIS — M25552 Pain in left hip: Secondary | ICD-10-CM | POA: Diagnosis not present

## 2018-04-21 DIAGNOSIS — M25562 Pain in left knee: Secondary | ICD-10-CM | POA: Diagnosis not present

## 2018-04-21 DIAGNOSIS — M6281 Muscle weakness (generalized): Secondary | ICD-10-CM | POA: Diagnosis not present

## 2018-04-26 DIAGNOSIS — M25552 Pain in left hip: Secondary | ICD-10-CM | POA: Diagnosis not present

## 2018-04-26 DIAGNOSIS — M25562 Pain in left knee: Secondary | ICD-10-CM | POA: Diagnosis not present

## 2018-04-26 DIAGNOSIS — M6281 Muscle weakness (generalized): Secondary | ICD-10-CM | POA: Diagnosis not present

## 2018-05-02 DIAGNOSIS — M25552 Pain in left hip: Secondary | ICD-10-CM | POA: Diagnosis not present

## 2018-05-02 DIAGNOSIS — M25562 Pain in left knee: Secondary | ICD-10-CM | POA: Diagnosis not present

## 2018-05-02 DIAGNOSIS — M6281 Muscle weakness (generalized): Secondary | ICD-10-CM | POA: Diagnosis not present

## 2018-05-03 DIAGNOSIS — D225 Melanocytic nevi of trunk: Secondary | ICD-10-CM | POA: Diagnosis not present

## 2018-05-03 DIAGNOSIS — D2272 Melanocytic nevi of left lower limb, including hip: Secondary | ICD-10-CM | POA: Diagnosis not present

## 2018-05-03 DIAGNOSIS — Z85828 Personal history of other malignant neoplasm of skin: Secondary | ICD-10-CM | POA: Diagnosis not present

## 2018-05-03 DIAGNOSIS — L814 Other melanin hyperpigmentation: Secondary | ICD-10-CM | POA: Diagnosis not present

## 2018-05-03 DIAGNOSIS — D1801 Hemangioma of skin and subcutaneous tissue: Secondary | ICD-10-CM | POA: Diagnosis not present

## 2018-05-03 DIAGNOSIS — L821 Other seborrheic keratosis: Secondary | ICD-10-CM | POA: Diagnosis not present

## 2018-05-03 DIAGNOSIS — L438 Other lichen planus: Secondary | ICD-10-CM | POA: Diagnosis not present

## 2018-05-03 DIAGNOSIS — D2271 Melanocytic nevi of right lower limb, including hip: Secondary | ICD-10-CM | POA: Diagnosis not present

## 2018-05-03 DIAGNOSIS — L570 Actinic keratosis: Secondary | ICD-10-CM | POA: Diagnosis not present

## 2018-05-03 DIAGNOSIS — L720 Epidermal cyst: Secondary | ICD-10-CM | POA: Diagnosis not present

## 2018-05-10 DIAGNOSIS — M6281 Muscle weakness (generalized): Secondary | ICD-10-CM | POA: Diagnosis not present

## 2018-05-10 DIAGNOSIS — M25562 Pain in left knee: Secondary | ICD-10-CM | POA: Diagnosis not present

## 2018-05-10 DIAGNOSIS — M25552 Pain in left hip: Secondary | ICD-10-CM | POA: Diagnosis not present

## 2018-05-23 DIAGNOSIS — Z1283 Encounter for screening for malignant neoplasm of skin: Secondary | ICD-10-CM | POA: Diagnosis not present

## 2018-05-23 DIAGNOSIS — J340 Abscess, furuncle and carbuncle of nose: Secondary | ICD-10-CM | POA: Diagnosis not present

## 2018-05-23 DIAGNOSIS — R030 Elevated blood-pressure reading, without diagnosis of hypertension: Secondary | ICD-10-CM | POA: Diagnosis not present

## 2018-05-30 DIAGNOSIS — Z1283 Encounter for screening for malignant neoplasm of skin: Secondary | ICD-10-CM | POA: Diagnosis not present

## 2018-05-30 DIAGNOSIS — L570 Actinic keratosis: Secondary | ICD-10-CM | POA: Diagnosis not present

## 2018-05-30 DIAGNOSIS — S20402A Unspecified superficial injuries of left back wall of thorax, initial encounter: Secondary | ICD-10-CM | POA: Diagnosis not present

## 2018-05-30 DIAGNOSIS — L538 Other specified erythematous conditions: Secondary | ICD-10-CM | POA: Diagnosis not present

## 2018-06-01 DIAGNOSIS — E7800 Pure hypercholesterolemia, unspecified: Secondary | ICD-10-CM | POA: Diagnosis not present

## 2018-06-01 DIAGNOSIS — R5383 Other fatigue: Secondary | ICD-10-CM | POA: Diagnosis not present

## 2018-06-01 DIAGNOSIS — R635 Abnormal weight gain: Secondary | ICD-10-CM | POA: Diagnosis not present

## 2018-06-01 DIAGNOSIS — E559 Vitamin D deficiency, unspecified: Secondary | ICD-10-CM | POA: Diagnosis not present

## 2018-06-01 DIAGNOSIS — Z131 Encounter for screening for diabetes mellitus: Secondary | ICD-10-CM | POA: Diagnosis not present

## 2018-06-01 DIAGNOSIS — E291 Testicular hypofunction: Secondary | ICD-10-CM | POA: Diagnosis not present

## 2018-06-01 DIAGNOSIS — R0602 Shortness of breath: Secondary | ICD-10-CM | POA: Diagnosis not present

## 2018-06-01 DIAGNOSIS — E8881 Metabolic syndrome: Secondary | ICD-10-CM | POA: Diagnosis not present

## 2018-06-01 DIAGNOSIS — Z79899 Other long term (current) drug therapy: Secondary | ICD-10-CM | POA: Diagnosis not present

## 2018-06-01 DIAGNOSIS — Z125 Encounter for screening for malignant neoplasm of prostate: Secondary | ICD-10-CM | POA: Diagnosis not present

## 2018-06-22 DIAGNOSIS — R9431 Abnormal electrocardiogram [ECG] [EKG]: Secondary | ICD-10-CM | POA: Diagnosis not present

## 2018-06-22 DIAGNOSIS — R0602 Shortness of breath: Secondary | ICD-10-CM | POA: Diagnosis not present

## 2018-06-23 DIAGNOSIS — R9431 Abnormal electrocardiogram [ECG] [EKG]: Secondary | ICD-10-CM | POA: Diagnosis not present

## 2018-06-27 DIAGNOSIS — R9431 Abnormal electrocardiogram [ECG] [EKG]: Secondary | ICD-10-CM | POA: Diagnosis not present

## 2018-07-01 DIAGNOSIS — Z79899 Other long term (current) drug therapy: Secondary | ICD-10-CM | POA: Diagnosis not present

## 2018-07-01 DIAGNOSIS — E559 Vitamin D deficiency, unspecified: Secondary | ICD-10-CM | POA: Diagnosis not present

## 2018-07-01 DIAGNOSIS — Z23 Encounter for immunization: Secondary | ICD-10-CM | POA: Diagnosis not present

## 2018-07-01 DIAGNOSIS — R635 Abnormal weight gain: Secondary | ICD-10-CM | POA: Diagnosis not present

## 2018-07-01 DIAGNOSIS — E7800 Pure hypercholesterolemia, unspecified: Secondary | ICD-10-CM | POA: Diagnosis not present

## 2018-07-21 DIAGNOSIS — R5383 Other fatigue: Secondary | ICD-10-CM | POA: Diagnosis not present

## 2018-08-18 DIAGNOSIS — M5442 Lumbago with sciatica, left side: Secondary | ICD-10-CM | POA: Diagnosis not present

## 2018-08-18 DIAGNOSIS — M545 Low back pain: Secondary | ICD-10-CM | POA: Diagnosis not present

## 2018-09-01 DIAGNOSIS — M5416 Radiculopathy, lumbar region: Secondary | ICD-10-CM | POA: Diagnosis not present

## 2018-09-25 DIAGNOSIS — J069 Acute upper respiratory infection, unspecified: Secondary | ICD-10-CM | POA: Diagnosis not present

## 2018-09-25 DIAGNOSIS — J029 Acute pharyngitis, unspecified: Secondary | ICD-10-CM | POA: Diagnosis not present

## 2018-09-25 DIAGNOSIS — R509 Fever, unspecified: Secondary | ICD-10-CM | POA: Diagnosis not present

## 2018-11-09 ENCOUNTER — Ambulatory Visit (INDEPENDENT_AMBULATORY_CARE_PROVIDER_SITE_OTHER): Payer: Medicare Other | Admitting: Family Medicine

## 2018-11-09 ENCOUNTER — Other Ambulatory Visit: Payer: Self-pay

## 2018-11-09 DIAGNOSIS — F419 Anxiety disorder, unspecified: Secondary | ICD-10-CM

## 2018-11-09 MED ORDER — LORAZEPAM 0.5 MG PO TABS: 0.5000 mg | ORAL_TABLET | Freq: Three times a day (TID) | ORAL | 0 refills | Status: DC | PRN

## 2018-11-09 NOTE — Progress Notes (Signed)
Virtual Visit via Video Note  I connected with Eric [name] on 11/09/18 at 10:30 AM EDT by a video enabled telemedicine application and verified that I am speaking with the correct person using two identifiers.  Location patient: home Location provider:work or home office Persons participating in the virtual visit: patient, provider  I discussed the limitations of evaluation and management by telemedicine and the availability of in person appointments. The patient expressed understanding and agreed to proceed.   HPI: Patient called predominantly to discuss some anxiety issues.  He has had tremendous amount of transition and change past couple years.  2 years ago after moving back here from [place] and his wife left him shortly after that (they had been married for 40 years).  He then started dating last January and then his wife called around May and wanted to get back together.  He was back together with her for just 2 weeks and then she left again.  Overall, he is coping fairly well.  He had some severe depression back then (2 years ago) and was treated briefly with antidepressant.  He retired last July.  He started exercising regularly and states he has lost about 15 pounds due to his efforts and feels great overall physically.  He states he had a couple episodes of what he described as possible panic attack.  These occurred going over a bridge.  He has had some avoidance type behavior with heights and bridges since then.  He does not feel he needs any antidepressant medication but really more antianxiety just for these rare occurrences.  He has a son who lives up [place] and he has to cross a bridge to get to where he lives.  Generally sleeping well.   ROS: See pertinent positives and negatives per HPI.  Past Medical History:  Diagnosis Date  . Anal fissure   . Chicken pox   . DEPRESSION 12/25/2008  . Dyslipidemia   . LIBIDO, DECREASED 12/25/2008  . SKIN CANCER, HX OF 12/25/2008     Past Surgical History:  Procedure Laterality Date  . KNEE ARTHROSCOPY  1994, 1996   right, left  . NASAL POLYP SURGERY  2004, 2009, 2012   x 4  . SHOULDER ARTHROSCOPY W/ ROTATOR CUFF REPAIR  2005  . TONSILLECTOMY      Family History  Problem Relation Age of Onset  . Alzheimer's disease Father 37  . Cancer Father        melanoma  . Multiple sclerosis Brother     SOCIAL HX: Smoking 2008.  Drinks some vodka most days.  Denies any binge drinking.  Retired last summer   Current Outpatient Medications:  .  fluticasone (FLONASE) 50 MCG/ACT nasal spray, Place 2 sprays into both nostrils 2 (two) times daily., Disp: , Rfl:  .  LORazepam (ATIVAN) 0.5 MG tablet, Take 1 tablet (0.5 mg total) by mouth every 8 (eight) hours as needed for anxiety., Disp: 30 tablet, Rfl: 0  EXAM:  VITALS per patient if applicable:  GENERAL: alert, oriented, appears well and in no acute distress  HEENT: atraumatic, conjunttiva clear, no obvious abnormalities on inspection of external nose and ears  NECK: normal movements of the head and neck  LUNGS: on inspection no signs of respiratory distress, breathing rate appears normal, no obvious gross SOB, gasping or wheezing  CV: no obvious cyanosis  MS: moves all visible extremities without noticeable abnormality  PSYCH/NEURO: pleasant and cooperative, no obvious depression or anxiety, speech and thought processing grossly  intact  ASSESSMENT AND PLAN:  Discussed the following assessment and plan:  Anxiety symptoms.  These seem to be somewhat situational.  He has developed some phobic type symptoms with height such as bridges  -We recommend he consider counseling and he will consider this -We wrote for limited lorazepam 0.5 mg to take only for severe situational anxiety -Discussed nonpharmacologic management of anxiety in general -Consider scheduling complete physical soon     I discussed the assessment and treatment plan with the patient. The  patient was provided an opportunity to ask questions and all were answered. The patient agreed with the plan and demonstrated an understanding of the instructions.   The patient was advised to call back or seek an in-person evaluation if the symptoms worsen or if the condition fails to improve as anticipated.   Carolann Littler, MD

## 2019-01-23 ENCOUNTER — Encounter: Payer: Self-pay | Admitting: Family Medicine

## 2019-01-23 ENCOUNTER — Ambulatory Visit (INDEPENDENT_AMBULATORY_CARE_PROVIDER_SITE_OTHER): Payer: Medicare Other | Admitting: Family Medicine

## 2019-01-23 ENCOUNTER — Other Ambulatory Visit: Payer: Self-pay

## 2019-01-23 VITALS — BP 132/80 | HR 97 | Temp 98.0°F | Ht 69.0 in | Wt 191.2 lb

## 2019-01-23 DIAGNOSIS — M7918 Myalgia, other site: Secondary | ICD-10-CM

## 2019-01-23 DIAGNOSIS — Z23 Encounter for immunization: Secondary | ICD-10-CM | POA: Diagnosis not present

## 2019-01-23 NOTE — Patient Instructions (Signed)
We gave Prevnar 13 (Pneumonia vaccine) today and recommend a Pneumovax in one year  Consider shingles vaccine (Shingrix).  I am setting up sports medicine referral.

## 2019-01-23 NOTE — Progress Notes (Signed)
Subjective:     Patient ID: Eric [name], male   DOB: 05-Jan-1951, 68 y.o.   MRN: [id number]  HPI   Patient is here to discuss the following items  Past history of depression.  He had severe depression few years ago after going through separation and now divorce from his wife.  He made some major lifestyle changes couple years ago with working out and lost from 245 pounds to current weight of 191 pounds.  He did struggle with the social isolation with current pandemic but has recently connected with some folks over in [facility] for pickleball couple times per week.  That seems to be helping. He retired last summer and is enjoying retirement overall.  He has had some recent problems over the past 5 months or so with left gluteus pain.  No radiculitis symptoms.  No back pain.  He tried some lunges and other exercises without much improvement.  He would like to see orthopedic specialist for that.  No history of shingles vaccine.  He also needs Prevnar 13. He states he had lab work sometime last October through another clinic and he is declining further labs at this time  Past Medical History:  Diagnosis Date  . Anal fissure   . Chicken pox   . DEPRESSION 12/25/2008  . Dyslipidemia   . LIBIDO, DECREASED 12/25/2008  . SKIN CANCER, HX OF 12/25/2008   Past Surgical History:  Procedure Laterality Date  . KNEE ARTHROSCOPY  1994, 1996   right, left  . NASAL POLYP SURGERY  2004, 2009, 2012   x 4  . SHOULDER ARTHROSCOPY W/ ROTATOR CUFF REPAIR  2005  . TONSILLECTOMY      reports that he quit smoking about 12 years ago. His smoking use included cigarettes. He has never used smokeless tobacco. He reports current alcohol use. He reports that he does not use drugs. family history includes Alzheimer's disease (age of onset: 67) in his father; Cancer in his father; Multiple sclerosis in his brother. Allergies  Allergen Reactions  . Ciprofloxacin Diarrhea    REACTION: GI upset, C-diff  .  Metronidazole Hives  . Flagyl [Metronidazole Hcl] Rash     Review of Systems  Constitutional: Negative for fatigue.  Eyes: Negative for visual disturbance.  Respiratory: Negative for cough, chest tightness and shortness of breath.   Cardiovascular: Negative for chest pain, palpitations and leg swelling.  Endocrine: Negative for polydipsia and polyuria.  Genitourinary: Negative for dysuria.  Musculoskeletal: Negative for gait problem.  Neurological: Negative for dizziness, syncope, weakness, light-headedness and headaches.  Psychiatric/Behavioral: Positive for dysphoric mood.       Objective:   Physical Exam Constitutional:      Appearance: He is well-developed.  HENT:     Right Ear: External ear normal.     Left Ear: External ear normal.  Eyes:     Pupils: Pupils are equal, round, and reactive to light.  Neck:     Musculoskeletal: Neck supple.     Thyroid: No thyromegaly.  Cardiovascular:     Rate and Rhythm: Normal rate and regular rhythm.  Pulmonary:     Effort: Pulmonary effort is normal. No respiratory distress.     Breath sounds: Normal breath sounds. No wheezing or rales.  Musculoskeletal:     Right lower leg: No edema.     Left lower leg: No edema.     Comments: Full range of motion left hip.  No lateral hip tenderness.  Neurological:     Mental  Status: He is alert and oriented to person, place, and time.        Assessment:     #1 history of depression.  PHQ 2 equals 0  #2 several month history of left gluteus pain  #3 health maintenance.  Patient needs Prevnar 13.  No history of shingles vaccine.  Reportedly had labs last October and we have no record of those yet    Plan:     -Set up sports medicine referral regarding chronic several month history of left gluteus pain  -Prevnar 13 given.  Recommend Pneumovax in 1 year  -Discussed shingles vaccine which she will consider  [name] MD  Primary Care at [facility]

## 2019-01-24 ENCOUNTER — Telehealth: Payer: Self-pay | Admitting: Family Medicine

## 2019-01-24 NOTE — Telephone Encounter (Signed)
Pt calling in.  States that he forgot to ask at [facility] yesterday for a new medication. Pt would like to know if he could be put on: Generic Cialis 20mg  Generic Viagra 100mg  Pt uses  [place], [place] [phone] (Phone) [phone] (Fax)

## 2019-01-24 NOTE — Telephone Encounter (Signed)
We can send in Viagra 100 mg - take one half to one tablet daily as needed #10 with 5 refills.

## 2019-01-24 NOTE — Telephone Encounter (Signed)
Please see new message

## 2019-01-25 ENCOUNTER — Other Ambulatory Visit: Payer: Self-pay

## 2019-01-25 MED ORDER — SILDENAFIL CITRATE 100 MG PO TABS: 50.0000 mg | ORAL_TABLET | Freq: Every day | ORAL | 5 refills | Status: DC | PRN

## 2019-01-25 NOTE — Telephone Encounter (Signed)
This medication has been sent to the pharmacy requested.

## 2019-01-26 ENCOUNTER — Encounter: Payer: Self-pay | Admitting: Family Medicine

## 2019-01-27 MED ORDER — TADALAFIL 20 MG PO TABS: ORAL_TABLET | ORAL | 5 refills | Status: DC

## 2019-01-27 NOTE — Telephone Encounter (Signed)
done

## 2019-01-29 NOTE — Progress Notes (Signed)
[facility] [address] [place], [place] [address] Phone: [phone] Subjective:   I Eric [name] am serving as a Education administrator for Dr. Hulan Saas.  I'm seeing this patient by the request  of:  [name], MD   CC: Left hip and back pain  [id number]  Eric [name] is a 69 y.o. male coming in with complaint of left glut pain. Plays pickle ball. Has had radiating pain previously. History of IT band tightness.   Onset- 4 months  Location - Piriformis   Character- sharp with weight bearing  Aggravating factors- walking, sitting, stairs  Reliving factors-  Therapies tried- aleve in the mornings  Severity-6 out of 10     Past Medical History:  Diagnosis Date  . Anal fissure   . Chicken pox   . DEPRESSION 12/25/2008  . Dyslipidemia   . LIBIDO, DECREASED 12/25/2008  . SKIN CANCER, HX OF 12/25/2008   Past Surgical History:  Procedure Laterality Date  . KNEE ARTHROSCOPY  1994, 1996   right, left  . NASAL POLYP SURGERY  2004, 2009, 2012   x 4  . SHOULDER ARTHROSCOPY W/ ROTATOR CUFF REPAIR  2005  . TONSILLECTOMY     Social History   Socioeconomic History  . Marital status: Divorced    Spouse name: Not on file  . Number of children: 3  . Years of education: Not on file  . Highest education level: Not on file  Occupational History  . Occupation: Production designer, theatre/television/film: [employer]  Social Needs  . Financial resource strain: Not on file  . Food insecurity    Worry: Not on file    Inability: Not on file  . Transportation needs    Medical: Not on file    Non-medical: Not on file  Tobacco Use  . Smoking status: Former Smoker    Types: Cigarettes    Quit date: 11/20/2006    Years since quitting: 12.2  . Smokeless tobacco: Never Used  Substance and Sexual Activity  . Alcohol use: Yes  . Drug use: No  . Sexual activity: Not on file  Lifestyle  . Physical activity    Days per week: Not on file    Minutes per  session: Not on file  . Stress: Not on file  Relationships  . Social Herbalist on phone: Not on file    Gets together: Not on file    Attends religious service: Not on file    Active member of club or organization: Not on file    Attends meetings of clubs or organizations: Not on file    Relationship status: Not on file  Other Topics Concern  . Not on file  Social History Narrative  . Not on file   Allergies  Allergen Reactions  . Ciprofloxacin Diarrhea    REACTION: GI upset, C-diff  . Metronidazole Hives  . Flagyl [Metronidazole Hcl] Rash   Family History  Problem Relation Age of Onset  . Alzheimer's disease Father 1  . Cancer Father        melanoma  . Multiple sclerosis Brother      Current Outpatient Medications (Cardiovascular):  .  tadalafil (CIALIS) 20 MG tablet, Take one tablet every other day as needed for erectile dysfunction  Current Outpatient Medications (Respiratory):  .  fluticasone (FLONASE) 50 MCG/ACT nasal spray, Place 2 sprays into both nostrils 2 (two) times daily.    Current Outpatient  Medications (Other):  Marland Kitchen  LORazepam (ATIVAN) 0.5 MG tablet, Take 1 tablet (0.5 mg total) by mouth every 8 (eight) hours as needed for anxiety. .  traZODone (DESYREL) 150 MG tablet,     Past medical history, social, surgical and family history all reviewed in electronic medical record.  No pertanent information unless stated regarding to the chief complaint.   Review of Systems:  No headache, visual changes, nausea, vomiting, diarrhea, constipation, dizziness, abdominal pain, skin rash, fevers, chills, night sweats, weight loss, swollen lymph nodes, body aches, joint swelling,  chest pain, shortness of breath, mood changes.  Positive muscle aches  Objective  Blood pressure 130/84, pulse 81, height 5\' 9"  (1.753 m), weight 191 lb (86.6 kg), SpO2 98 %. f    General: No apparent distress alert and oriented x3 mood and affect normal, dressed appropriately.   HEENT: Pupils equal, extraocular movements intact  Respiratory: Patient's speak in full sentences and does not appear short of breath  Cardiovascular: No lower extremity edema, non tender, no erythema  Skin: Warm dry intact with no signs of infection or rash on extremities or on axial skeleton.  Abdomen: Soft nontender  Neuro: Cranial nerves II through XII are intact, neurovascularly intact in all extremities with 2+ DTRs and 2+ pulses.  Lymph: No lymphadenopathy of posterior or anterior cervical chain or axillae bilaterally.  Gait normal with good balance and coordination.  MSK:  Non tender with full range of motion and good stability and symmetric strength and tone of shoulders, elbows, wrist , knee and ankles bilaterally.  Left hip area shows some mild decrease in range of motion.  Positive Faber test.  Negative straight leg test.  Patient does have tenderness to palpation over the piriformis.  5 out of 5 strength in lower extremity.  [id number]; 15 additional minutes spent for Therapeutic exercises as stated in above notes.  This included exercises focusing on stretching, strengthening, with significant focus on eccentric aspects.   Long term goals include an improvement in range of motion, strength, endurance as well as avoiding reinjury. Patient's frequency would include in 1-2 times a day, 3-5 times a week for a duration of 6-12 weeks Low back exercises that included:  Pelvic tilt/bracing instruction to focus on control of the pelvic girdle and lower abdominal muscles  Glute strengthening exercises, focusing on proper firing of the glutes without engaging the low back muscles Proper stretching techniques for maximum relief for the hamstrings, hip flexors, low back and some rotation where tolerated    Proper technique shown and discussed handout in great detail with ATC.  All questions were discussed and answered.     Impression and Recommendations:     This case required medical decision  making of moderate complexity. The above documentation has been reviewed and is accurate and complete Lyndal Pulley, DO       Note: This dictation was prepared with Dragon dictation along with smaller phrase technology. Any transcriptional errors that result from this process are unintentional.

## 2019-01-30 ENCOUNTER — Ambulatory Visit (INDEPENDENT_AMBULATORY_CARE_PROVIDER_SITE_OTHER): Payer: Medicare Other | Admitting: Family Medicine

## 2019-01-30 ENCOUNTER — Ambulatory Visit (INDEPENDENT_AMBULATORY_CARE_PROVIDER_SITE_OTHER)
Admission: RE | Admit: 2019-01-30 | Discharge: 2019-01-30 | Disposition: A | Discharge status: Home / Self Care | Payer: Medicare Other | Source: Ambulatory Visit | Attending: Family Medicine | Admitting: Family Medicine

## 2019-01-30 ENCOUNTER — Encounter: Payer: Self-pay | Admitting: Family Medicine

## 2019-01-30 ENCOUNTER — Other Ambulatory Visit: Payer: Self-pay

## 2019-01-30 VITALS — BP 130/84 | HR 81 | Ht 69.0 in | Wt 191.0 lb

## 2019-01-30 DIAGNOSIS — G5702 Lesion of sciatic nerve, left lower limb: Secondary | ICD-10-CM

## 2019-01-30 DIAGNOSIS — M549 Dorsalgia, unspecified: Secondary | ICD-10-CM

## 2019-01-30 DIAGNOSIS — M545 Low back pain: Secondary | ICD-10-CM | POA: Diagnosis not present

## 2019-01-30 NOTE — Patient Instructions (Addendum)
Good to see you xrays downstairs Exercise 3x a week Tennis ball in back pocket Turmeric 500mg  Vit D 2000IU daily See me in 6 weeks

## 2019-01-30 NOTE — Assessment & Plan Note (Signed)
Using Netter's Orthopaedic Anatomy, reviewed with the patient the structures involved and how they related to diagnosis. The patient indicated understanding.   The patient was given a handout about classic piriformis stretching including [employer], Modified [employer], my self-described "Sink Stretch," and other piriformis rehab.  We also reviewed hip flexor and abductor strengthening, ham stretching  Rec deep massage, explained self-massage with ball RTC in 6 weeks

## 2019-03-13 ENCOUNTER — Ambulatory Visit: Payer: Medicare Other | Admitting: Family Medicine

## 2019-04-01 DIAGNOSIS — B0089 Other herpesviral infection: Secondary | ICD-10-CM | POA: Diagnosis not present

## 2019-04-14 DIAGNOSIS — B009 Herpesviral infection, unspecified: Secondary | ICD-10-CM | POA: Diagnosis not present

## 2019-04-17 DIAGNOSIS — M7581 Other shoulder lesions, right shoulder: Secondary | ICD-10-CM | POA: Diagnosis not present

## 2019-04-17 DIAGNOSIS — M25511 Pain in right shoulder: Secondary | ICD-10-CM | POA: Diagnosis not present

## 2019-04-17 DIAGNOSIS — G5702 Lesion of sciatic nerve, left lower limb: Secondary | ICD-10-CM | POA: Diagnosis not present

## 2019-04-25 DIAGNOSIS — M79652 Pain in left thigh: Secondary | ICD-10-CM | POA: Diagnosis not present

## 2019-04-25 DIAGNOSIS — G5702 Lesion of sciatic nerve, left lower limb: Secondary | ICD-10-CM | POA: Diagnosis not present

## 2019-04-26 DIAGNOSIS — Z23 Encounter for immunization: Secondary | ICD-10-CM | POA: Diagnosis not present

## 2019-05-10 DIAGNOSIS — D1801 Hemangioma of skin and subcutaneous tissue: Secondary | ICD-10-CM | POA: Diagnosis not present

## 2019-05-10 DIAGNOSIS — L813 Cafe au lait spots: Secondary | ICD-10-CM | POA: Diagnosis not present

## 2019-05-10 DIAGNOSIS — D2262 Melanocytic nevi of left upper limb, including shoulder: Secondary | ICD-10-CM | POA: Diagnosis not present

## 2019-05-10 DIAGNOSIS — L814 Other melanin hyperpigmentation: Secondary | ICD-10-CM | POA: Diagnosis not present

## 2019-05-10 DIAGNOSIS — D225 Melanocytic nevi of trunk: Secondary | ICD-10-CM | POA: Diagnosis not present

## 2019-05-10 DIAGNOSIS — L570 Actinic keratosis: Secondary | ICD-10-CM | POA: Diagnosis not present

## 2019-05-10 DIAGNOSIS — L821 Other seborrheic keratosis: Secondary | ICD-10-CM | POA: Diagnosis not present

## 2019-05-24 ENCOUNTER — Encounter: Payer: Self-pay | Admitting: Family Medicine

## 2019-06-02 ENCOUNTER — Other Ambulatory Visit: Payer: Self-pay

## 2019-06-02 DIAGNOSIS — M7918 Myalgia, other site: Secondary | ICD-10-CM

## 2019-06-05 NOTE — Telephone Encounter (Signed)
Patient called to check status and states he has appt there tomorrow and needs this completed today. He has been waiting since Nov 6th for this to be done. Advised pt referral has been placed and will have referral coordinator follow up. He would like a call from the referral coordinator once this has been done.   [phone]

## 2019-06-06 ENCOUNTER — Telehealth: Payer: Self-pay | Admitting: Family Medicine

## 2019-06-06 NOTE — Telephone Encounter (Signed)
Called the patient  on 11/16/ 2020 and called on 06/06/2019 to inform that his referral was faxed Urgently to  Faxed to [phone]- office will contact pt to schedule [facility] in [place], [place] Address: [address], [place], [place] [address] Phone: [phone]    called the patient  x2 pt did not answer. I called the office to see if they received the referral  received conformation from that office that referral was received  Sent my chart msg to patient  To inform

## 2019-06-06 NOTE — Telephone Encounter (Signed)
Called the patient  on 11/16/ 2020 and called on 06/06/2019 to inform that his referral was faxed Urgently to  Faxed to [phone]- office will contact pt to schedule [facility] therapist in [place], [place] Address: [address], [place], [place] [address] Phone: [phone]    called the patient  x2 pt did not answer. I called the office to see if they received the referral  received conformation from that office that referral was received  Sent my chart msg to patient  To inform

## 2019-06-06 NOTE — Telephone Encounter (Signed)
Called the patient  on 11/16/ 2020 and called on 06/06/2019 to inform that his referral was faxed Urgently to  Faxed to [phone]- office will contact pt to schedule [facility] therapist in [place], [place] Address: [address], [place], [place] [address] Phone: [phone]    called the patient  x2 pt did not answer. I called the office to see if they received the referral  received conformation from that office that referral was received  Sent my chart msg to patient  To inform

## 2019-06-07 DIAGNOSIS — M25552 Pain in left hip: Secondary | ICD-10-CM | POA: Diagnosis not present

## 2019-06-12 DIAGNOSIS — M25552 Pain in left hip: Secondary | ICD-10-CM | POA: Diagnosis not present

## 2019-06-14 DIAGNOSIS — M25552 Pain in left hip: Secondary | ICD-10-CM | POA: Diagnosis not present

## 2019-06-19 DIAGNOSIS — M25552 Pain in left hip: Secondary | ICD-10-CM | POA: Diagnosis not present

## 2019-06-21 DIAGNOSIS — M25552 Pain in left hip: Secondary | ICD-10-CM | POA: Diagnosis not present

## 2019-06-23 ENCOUNTER — Encounter: Payer: Self-pay | Admitting: Family Medicine

## 2019-07-03 DIAGNOSIS — M25552 Pain in left hip: Secondary | ICD-10-CM | POA: Diagnosis not present

## 2019-08-03 ENCOUNTER — Encounter: Payer: Self-pay | Admitting: Family Medicine

## 2019-08-04 ENCOUNTER — Other Ambulatory Visit: Payer: Self-pay

## 2019-08-04 DIAGNOSIS — M7918 Myalgia, other site: Secondary | ICD-10-CM

## 2019-08-08 DIAGNOSIS — M25552 Pain in left hip: Secondary | ICD-10-CM | POA: Diagnosis not present

## 2019-08-12 ENCOUNTER — Ambulatory Visit: Payer: Medicare Other | Attending: Internal Medicine

## 2019-08-12 DIAGNOSIS — Z23 Encounter for immunization: Secondary | ICD-10-CM

## 2019-08-12 NOTE — Progress Notes (Signed)
   Covid-19 Vaccination Clinic  Name:  Eric [name]    MRN: [id number] DOB: [birth year]  08/12/2019  [name] was observed post Covid-19 immunization for 15 minutes without incidence. He was provided with Vaccine Information Sheet and instruction to access the V-Safe system.   [name] was instructed to call 911 with any severe reactions post vaccine: Marland Kitchen Difficulty breathing  . Swelling of your face and throat  . A fast heartbeat  . A bad rash all over your body  . Dizziness and weakness    Immunizations Administered    Name Date Dose VIS Date Route   Pfizer COVID-19 Vaccine 08/12/2019 12:42 PM 0.3 mL 06/30/2019 Intramuscular   Manufacturer: [place]   Lot: [id number]   [place]: [id number]

## 2019-08-13 ENCOUNTER — Encounter: Payer: Self-pay | Admitting: Family Medicine

## 2019-08-30 ENCOUNTER — Ambulatory Visit: Payer: Medicare Other | Attending: Internal Medicine

## 2019-08-30 DIAGNOSIS — Z20822 Contact with and (suspected) exposure to covid-19: Secondary | ICD-10-CM

## 2019-08-31 ENCOUNTER — Encounter: Payer: Self-pay | Admitting: Family Medicine

## 2019-08-31 LAB — NOVEL CORONAVIRUS, NAA: SARS-CoV-2, NAA: NOT DETECTED

## 2019-09-02 ENCOUNTER — Ambulatory Visit: Payer: Medicare Other | Attending: Internal Medicine

## 2019-09-02 DIAGNOSIS — Z23 Encounter for immunization: Secondary | ICD-10-CM | POA: Insufficient documentation

## 2019-09-02 NOTE — Progress Notes (Signed)
   Covid-19 Vaccination Clinic  Name:  Eric [name]    MRN: [id number] DOB: 1950-11-18  09/02/2019  Eric [name] was observed post Covid-19 immunization for 15 minutes without incidence. He was provided with Vaccine Information Sheet and instruction to access the V-Safe system.   Eric [name] was instructed to call 911 with any severe reactions post vaccine: Marland Kitchen Difficulty breathing  . Swelling of your face and throat  . A fast heartbeat  . A bad rash all over your body  . Dizziness and weakness    Immunizations Administered    Name Date Dose VIS Date Route   Pfizer COVID-19 Vaccine 09/02/2019 11:00 AM 0.3 mL 06/30/2019 Intramuscular   Manufacturer: [place]   Lot: [id number]   Eric [name]: [id number]

## 2019-09-24 DIAGNOSIS — R21 Rash and other nonspecific skin eruption: Secondary | ICD-10-CM | POA: Diagnosis not present

## 2019-09-27 DIAGNOSIS — M25552 Pain in left hip: Secondary | ICD-10-CM | POA: Diagnosis not present

## 2019-10-04 DIAGNOSIS — M25552 Pain in left hip: Secondary | ICD-10-CM | POA: Diagnosis not present

## 2019-11-15 DIAGNOSIS — L570 Actinic keratosis: Secondary | ICD-10-CM | POA: Diagnosis not present

## 2019-11-15 DIAGNOSIS — L820 Inflamed seborrheic keratosis: Secondary | ICD-10-CM | POA: Diagnosis not present

## 2019-11-15 DIAGNOSIS — L111 Transient acantholytic dermatosis [Grover]: Secondary | ICD-10-CM | POA: Diagnosis not present

## 2019-12-05 DIAGNOSIS — M5441 Lumbago with sciatica, right side: Secondary | ICD-10-CM | POA: Diagnosis not present

## 2019-12-05 DIAGNOSIS — M545 Low back pain: Secondary | ICD-10-CM | POA: Diagnosis not present

## 2020-01-29 ENCOUNTER — Encounter: Payer: Medicare Other | Admitting: Family Medicine

## 2020-03-06 DIAGNOSIS — L718 Other rosacea: Secondary | ICD-10-CM | POA: Diagnosis not present

## 2020-03-06 DIAGNOSIS — L218 Other seborrheic dermatitis: Secondary | ICD-10-CM | POA: Diagnosis not present

## 2020-03-06 DIAGNOSIS — L570 Actinic keratosis: Secondary | ICD-10-CM | POA: Diagnosis not present

## 2020-03-06 DIAGNOSIS — L111 Transient acantholytic dermatosis [Grover]: Secondary | ICD-10-CM | POA: Diagnosis not present

## 2020-03-30 ENCOUNTER — Encounter: Payer: Self-pay | Admitting: Family Medicine

## 2020-04-03 ENCOUNTER — Telehealth: Payer: Medicare Other | Admitting: Family Medicine

## 2020-04-08 ENCOUNTER — Other Ambulatory Visit: Payer: Self-pay

## 2020-04-09 ENCOUNTER — Encounter: Payer: Self-pay | Admitting: Family Medicine

## 2020-04-09 ENCOUNTER — Ambulatory Visit (INDEPENDENT_AMBULATORY_CARE_PROVIDER_SITE_OTHER): Payer: Medicare Other | Admitting: Family Medicine

## 2020-04-09 VITALS — BP 124/78 | HR 72 | Temp 98.2°F | Ht 69.0 in | Wt 201.0 lb

## 2020-04-09 DIAGNOSIS — R5383 Other fatigue: Secondary | ICD-10-CM | POA: Diagnosis not present

## 2020-04-09 DIAGNOSIS — F419 Anxiety disorder, unspecified: Secondary | ICD-10-CM

## 2020-04-09 DIAGNOSIS — R635 Abnormal weight gain: Secondary | ICD-10-CM

## 2020-04-09 DIAGNOSIS — Z23 Encounter for immunization: Secondary | ICD-10-CM

## 2020-04-09 DIAGNOSIS — Z125 Encounter for screening for malignant neoplasm of prostate: Secondary | ICD-10-CM | POA: Diagnosis not present

## 2020-04-09 DIAGNOSIS — E785 Hyperlipidemia, unspecified: Secondary | ICD-10-CM

## 2020-04-09 DIAGNOSIS — Z0000 Encounter for general adult medical examination without abnormal findings: Secondary | ICD-10-CM

## 2020-04-09 MED ORDER — LORAZEPAM 0.5 MG PO TABS: 0.5000 mg | ORAL_TABLET | Freq: Three times a day (TID) | ORAL | 0 refills | Status: DC | PRN

## 2020-04-09 NOTE — Addendum Note (Signed)
Addended by: Gwenyth Ober R on: 04/09/2020 10:30 AM   Modules accepted: Orders

## 2020-04-09 NOTE — Patient Instructions (Signed)
Set up repeat colonoscopy with Dr Collene Mares  We are giving pneumovax today which is your last  Consider Shingrix vaccine later this year.

## 2020-04-09 NOTE — Progress Notes (Signed)
Established Patient Office Visit  Subjective:  Patient ID: Eric [name], male    DOB: 1951-05-28  Age: 69 y.o. MRN: [id number]  CC:  Chief Complaint  Patient presents with  . Medicare Wellness    HPI Eric [name] presents for Medicare wellness visit.  He has past history of depression.  He underwent break-up with someone he been dating for the past year and that has been stressful for him.  He continues to exercise regularly but has gained some weight this past year and also having increased fatigue issues.  He had gotten his weight down to 188 pounds currently 201.  He is surprised by the weight gain as he feels like he is not eating a lot of calories.  He does complain of some fatigue issues. He is concerned about possible low testosterone. Intermittent sleep difficulties. Still exercising. Appetite is fair. No fevers, night sweats, chest pains, dyspnea. No change in urine or stool habits.  He has taken lorazepam infrequently for severe anxiety and requesting refill.  1.  Risk factors based on Past Medical , Social, and Family history reviewed and as indicated above with no changes  2.  Limitations in physical activities None.  No recent falls. Good balance.  Plays pickle ball regularly.  3.  Depression/mood-past history of severe depression.  Currently stable.  He does have some intermittent anxiety symptoms but not continually  4.  Hearing-has chronic bilateral hearing loss.  He has bilateral hearing aids.  5.  ADLs independent in all.  6.  Cognitive function (orientation to time and place, language, writing, speech,memory) no short or long term memory issues.  Language and judgement intact.  Does crossword puzzles regularly.  7.  Home Safety no issues  8.  Height, weight, and visual acuity -He gets regular eye exams. -Has had some gradual weight gain recently  Wt Readings from Last 3 Encounters:  04/09/20 201 lb (91.2 kg)  01/30/19 191 lb (86.6 kg)  01/23/19 191  lb 3.2 oz (86.7 kg)    9.  Counseling discussed Counseled regarding age and gender appropriate preventative screenings and immunizations.  10. Recommendation of preventive services. Recommend flu vaccine he plans to get this with his daughter next week Pneumovax given. We discussed Shingrix and he will consider getting that at some point this year  40. Labs based on risk factors-CBC, basic metabolic panel, TSH, hepatic panel, lipid panel, PSA, testosterone (based on his recent fatigue and weight gain)  12. Care Plan-as below  13. Other Providers Dr. Fontaine No, dermatology, Dr. Dorna Leitz, orthopedics  14. Written schedule of screening/prevention services given to patient.  Health Maintenance  Topic Date Due  . PNA vac Low Risk Adult (2 of 2 - PPSV23) 01/23/2020  . INFLUENZA VACCINE  10/17/2020 (Originally 02/18/2020)  . TETANUS/TDAP  08/08/2024  . COLONOSCOPY  01/22/2025  . COVID-19 Vaccine  Completed  . Hepatitis C Screening  Completed      Past Medical History:  Diagnosis Date  . Anal fissure   . Chicken pox   . DEPRESSION 12/25/2008  . Dyslipidemia   . LIBIDO, DECREASED 12/25/2008  . SKIN CANCER, HX OF 12/25/2008    Past Surgical History:  Procedure Laterality Date  . KNEE ARTHROSCOPY  1994, 1996   right, left  . NASAL POLYP SURGERY  2004, 2009, 2012   x 4  . SHOULDER ARTHROSCOPY W/ ROTATOR CUFF REPAIR  2005  . TONSILLECTOMY      Family History  Problem Relation Age of Onset  .  Alzheimer's disease Father 66  . Cancer Father        melanoma  . Multiple sclerosis Brother     Social History   Socioeconomic History  . Marital status: Divorced    Spouse name: Not on file  . Number of children: 3  . Years of education: Not on file  . Highest education level: Not on file  Occupational History  . Occupation: Production designer, theatre/television/film: [employer]  Tobacco Use  . Smoking status: Former Smoker    Types: Cigarettes    Quit date: 11/20/2006    Years  since quitting: 13.3  . Smokeless tobacco: Never Used  Vaping Use  . Vaping Use: Never used  Substance and Sexual Activity  . Alcohol use: Yes  . Drug use: No  . Sexual activity: Not on file  Other Topics Concern  . Not on file  Social History Narrative  . Not on file   Social Determinants of Health   Financial Resource Strain:   . Difficulty of Paying Living Expenses: Not on file  Food Insecurity:   . Worried About Charity fundraiser in the Last Year: Not on file  . Ran Out of Food in the Last Year: Not on file  Transportation Needs:   . Lack of Transportation (Medical): Not on file  . Lack of Transportation (Non-Medical): Not on file  Physical Activity:   . Days of Exercise per Week: Not on file  . Minutes of Exercise per Session: Not on file  Stress:   . Feeling of Stress : Not on file  Social Connections:   . Frequency of Communication with Friends and Family: Not on file  . Frequency of Social Gatherings with Friends and Family: Not on file  . Attends Religious Services: Not on file  . Active Member of Clubs or Organizations: Not on file  . Attends Archivist Meetings: Not on file  . Marital Status: Not on file  Intimate Partner Violence:   . Fear of Current or Ex-Partner: Not on file  . Emotionally Abused: Not on file  . Physically Abused: Not on file  . Sexually Abused: Not on file    Outpatient Medications Prior to Visit  Medication Sig Dispense Refill  . fluticasone (FLONASE) 50 MCG/ACT nasal spray Place 2 sprays into both nostrils 2 (two) times daily.    . tadalafil (CIALIS) 20 MG tablet Take one tablet every other day as needed for erectile dysfunction 30 tablet 5  . LORazepam (ATIVAN) 0.5 MG tablet Take 1 tablet (0.5 mg total) by mouth every 8 (eight) hours as needed for anxiety. 30 tablet 0  . ID NOW COVID-19 KIT See admin instructions.    . traZODone (DESYREL) 150 MG tablet      No facility-administered medications prior to visit.     Allergies  Allergen Reactions  . Ciprofloxacin Diarrhea    REACTION: GI upset, C-diff  . Metronidazole Hives  . Flagyl [Metronidazole Hcl] Rash    ROS Review of Systems  Constitutional: Negative for activity change, appetite change, fatigue, fever and unexpected weight change.  HENT: Negative for congestion, ear pain and trouble swallowing.   Eyes: Negative for pain and visual disturbance.  Respiratory: Negative for cough, shortness of breath and wheezing.   Cardiovascular: Negative for chest pain and palpitations.  Gastrointestinal: Negative for abdominal distention, abdominal pain, blood in stool, constipation, diarrhea, nausea, rectal pain and vomiting.  Endocrine: Negative for polydipsia and polyuria.  Genitourinary: Negative for dysuria, hematuria and testicular pain.  Musculoskeletal: Negative for arthralgias and joint swelling.  Skin: Negative for rash.  Neurological: Negative for dizziness, syncope and headaches.  Hematological: Negative for adenopathy.  Psychiatric/Behavioral: Negative for confusion and dysphoric mood.      Objective:    Physical Exam Constitutional:      General: He is not in acute distress.    Appearance: He is well-developed.  HENT:     Head: Normocephalic and atraumatic.     Right Ear: External ear normal.     Left Ear: External ear normal.  Eyes:     Conjunctiva/sclera: Conjunctivae normal.     Pupils: Pupils are equal, round, and reactive to light.  Neck:     Thyroid: No thyromegaly.  Cardiovascular:     Rate and Rhythm: Normal rate and regular rhythm.     Heart sounds: Normal heart sounds. No murmur heard.   Pulmonary:     Effort: No respiratory distress.     Breath sounds: No wheezing or rales.  Abdominal:     General: Bowel sounds are normal. There is no distension.     Palpations: Abdomen is soft. There is no mass.     Tenderness: There is no abdominal tenderness. There is no guarding or rebound.  Musculoskeletal:      Cervical back: Normal range of motion and neck supple.     Right lower leg: No edema.     Left lower leg: No edema.  Lymphadenopathy:     Cervical: No cervical adenopathy.  Skin:    Findings: No rash.  Neurological:     Mental Status: He is alert and oriented to person, place, and time.     Cranial Nerves: No cranial nerve deficit.     Deep Tendon Reflexes: Reflexes normal.     BP 124/78   Pulse 72   Temp 98.2 F (36.8 C) (Oral)   Ht _0  (1.753 m)   Wt 201 lb (91.2 kg)   SpO2 97%   BMI 29.68 kg/m  Wt Readings from Last 3 Encounters:  04/09/20 201 lb (91.2 kg)  01/30/19 191 lb (86.6 kg)  01/23/19 191 lb 3.2 oz (86.7 kg)     Health Maintenance Due  Topic Date Due  . PNA vac Low Risk Adult (2 of 2 - PPSV23) 01/23/2020    There are no preventive care reminders to display for this patient.  Lab Results  Component Value Date   TSH 1.14 09/11/2016   Lab Results  Component Value Date   WBC 4.8 09/11/2016   HGB 14.6 09/11/2016   HCT 43.4 09/11/2016   MCV 91.5 09/11/2016   PLT 219.0 09/11/2016   Lab Results  Component Value Date   NA 138 09/11/2016   K 3.8 09/11/2016   CO2 25 09/11/2016   GLUCOSE 114 (H) 09/11/2016   BUN 16 09/11/2016   CREATININE 0.75 09/11/2016   BILITOT 1.1 09/11/2016   ALKPHOS 73 09/11/2016   AST 19 09/11/2016   ALT 15 09/11/2016   PROT 6.8 09/11/2016   ALBUMIN 4.1 09/11/2016   CALCIUM 9.0 09/11/2016   GFR 111.58 09/11/2016   Lab Results  Component Value Date   CHOL 208 (H) 09/11/2016   Lab Results  Component Value Date   HDL 66.40 09/11/2016   Lab Results  Component Value Date   LDLCALC 119 (H) 09/11/2016   Lab Results  Component Value Date   TRIG 113.0 09/11/2016   Lab Results  Component Value Date   CHOLHDL 3 09/11/2016   No results found for: HGBA1C    Assessment & Plan:   Problem List Items Addressed This Visit    None    Visit Diagnoses    Medicare annual wellness visit, subsequent    -  Primary    Weight gain       Relevant Orders   Basic metabolic panel   TSH   Fatigue, unspecified type       Relevant Orders   Basic metabolic panel   CBC with Differential/Platelet   TSH   Testosterone   Dyslipidemia       Relevant Orders   Lipid panel   Hepatic function panel   Prostate cancer screening       Relevant Orders   PSA    -Patient will check with his GI provider regarding scheduling repeat colonoscopy -Pneumovax given -He plans to get flu vaccine in a couple weeks -We discussed Shingrix vaccine and he will consider getting this through pharmacy -Check testosterone level as above. If this is low we will need to confirm with repeat reading in about a month -Continue regular exercise habits -Covid vaccines already given -Discussed anxiety management. Limit benzodiazepine use. He uses lorazepam very infrequently and only for severe anxiety symptoms. Discussed possible reinitiation of SSRI of his anxiety symptoms worsen in frequency or severity  Meds ordered this encounter  Medications  . LORazepam (ATIVAN) 0.5 MG tablet    Sig: Take 1 tablet (0.5 mg total) by mouth every 8 (eight) hours as needed for anxiety.    Dispense:  30 tablet    Refill:  0    Follow-up: No follow-ups on file.    Carolann Littler, MD

## 2020-04-10 LAB — LIPID PANEL
Cholesterol: 240 mg/dL — ABNORMAL HIGH (ref <200)
HDL: 73 mg/dL (ref >=40)
LDL Cholesterol (Calc): 144 mg/dL (calc) — ABNORMAL HIGH
Non-HDL Cholesterol (Calc): 167 mg/dL (calc) — ABNORMAL HIGH (ref <130)
Total CHOL/HDL Ratio: 3.3 (calc) (ref <5.0)
Triglycerides: 117 mg/dL (ref <150)

## 2020-04-10 LAB — BASIC METABOLIC PANEL
BUN: 17 mg/dL (ref 7–25)
CO2: 24 mmol/L (ref 20–32)
Calcium: 9.5 mg/dL (ref 8.6–10.3)
Chloride: 103 mmol/L (ref 98–110)
Creat: 0.79 mg/dL (ref 0.70–1.25)
Glucose, Bld: 117 mg/dL — ABNORMAL HIGH (ref 65–99)
Potassium: 4 mmol/L (ref 3.5–5.3)
Sodium: 139 mmol/L (ref 135–146)

## 2020-04-10 LAB — HEPATIC FUNCTION PANEL
AG Ratio: 1.5 (calc) (ref 1.0–2.5)
ALT: 19 U/L (ref 9–46)
AST: 23 U/L (ref 10–35)
Albumin: 4.5 g/dL (ref 3.6–5.1)
Alkaline phosphatase (APISO): 80 U/L (ref 35–144)
Bilirubin, Direct: 0.2 mg/dL (ref 0.0–0.2)
Globulin: 3 g/dL (calc) (ref 1.9–3.7)
Indirect Bilirubin: 0.6 mg/dL (calc) (ref 0.2–1.2)
Total Bilirubin: 0.8 mg/dL (ref 0.2–1.2)
Total Protein: 7.5 g/dL (ref 6.1–8.1)

## 2020-04-10 LAB — TSH: TSH: 1.05 mIU/L (ref 0.40–4.50)

## 2020-04-10 LAB — CBC WITH DIFFERENTIAL/PLATELET
Absolute Monocytes: 649 cells/uL (ref 200–950)
Basophils Absolute: 38 cells/uL (ref 0–200)
Basophils Relative: 0.8 %
Eosinophils Absolute: 268 cells/uL (ref 15–500)
Eosinophils Relative: 5.7 %
HCT: 44.2 % (ref 38.5–50.0)
Hemoglobin: 14.9 g/dL (ref 13.2–17.1)
Lymphs Abs: 837 cells/uL — ABNORMAL LOW (ref 850–3900)
MCH: 31.9 pg (ref 27.0–33.0)
MCHC: 33.7 g/dL (ref 32.0–36.0)
MCV: 94.6 fL (ref 80.0–100.0)
MPV: 10.1 fL (ref 7.5–12.5)
Monocytes Relative: 13.8 %
Neutro Abs: 2909 cells/uL (ref 1500–7800)
Neutrophils Relative %: 61.9 %
Platelets: 218 10*3/uL (ref 140–400)
RBC: 4.67 10*6/uL (ref 4.20–5.80)
RDW: 12.5 % (ref 11.0–15.0)
Total Lymphocyte: 17.8 %
WBC: 4.7 10*3/uL (ref 3.8–10.8)

## 2020-04-10 LAB — PSA: PSA: 0.33 ng/mL (ref <=4.0)

## 2020-04-10 LAB — TESTOSTERONE: Testosterone: 502 ng/dL (ref 250–827)

## 2020-04-11 ENCOUNTER — Other Ambulatory Visit: Payer: Self-pay | Admitting: Family Medicine

## 2020-04-11 DIAGNOSIS — E785 Hyperlipidemia, unspecified: Secondary | ICD-10-CM

## 2020-04-11 NOTE — Progress Notes (Signed)
Per PCP created future order for Lipid & HFP/pt will need completed in Dec 2021/thx dmf

## 2020-04-16 DIAGNOSIS — R140 Abdominal distension (gaseous): Secondary | ICD-10-CM | POA: Diagnosis not present

## 2020-04-16 DIAGNOSIS — Z1211 Encounter for screening for malignant neoplasm of colon: Secondary | ICD-10-CM | POA: Diagnosis not present

## 2020-04-23 DIAGNOSIS — Z23 Encounter for immunization: Secondary | ICD-10-CM | POA: Diagnosis not present

## 2020-04-29 ENCOUNTER — Encounter: Payer: Self-pay | Admitting: Family Medicine

## 2020-04-30 ENCOUNTER — Ambulatory Visit (INDEPENDENT_AMBULATORY_CARE_PROVIDER_SITE_OTHER): Payer: Medicare Other | Admitting: Family Medicine

## 2020-04-30 ENCOUNTER — Other Ambulatory Visit: Payer: Self-pay

## 2020-04-30 VITALS — BP 110/78 | HR 104 | Temp 98.3°F | Wt 202.3 lb

## 2020-04-30 DIAGNOSIS — R42 Dizziness and giddiness: Secondary | ICD-10-CM

## 2020-04-30 NOTE — Progress Notes (Signed)
Established Patient Office Visit  Subjective:  Patient ID: Eric [name], male    DOB: Apr 15, 1951  Age: 69 y.o. MRN: [id number]  CC:  Chief Complaint  Patient presents with  . Dizziness    happening for 10 day now    HPI   Eric [name] presents for dizziness.  He was away on a trip out of town with some friends and this seemed to start about 10 days ago.  His dizziness seems to clearly be positional related.  He notices when he stands up from a seated position.  He has not had any syncope.  He sometimes has some sensation of imbalance but no vertigo symptoms.  No headaches.  No chest pains or dyspnea.  No recent fevers.  No focal weakness.  Denies any recent nausea, vomiting, or diarrhea symptoms.  He feels like he has been hydrating fairly well.  He had recent physical exam and had multiple labs that including CBC, thyroid functions, chemistry and these were all basically normal.  Denies any associated pain.  Appetite has been stable.  No temperature instability.  No major fatigue issues.  Past Medical History:  Diagnosis Date  . Anal fissure   . Chicken pox   . DEPRESSION 12/25/2008  . Dyslipidemia   . LIBIDO, DECREASED 12/25/2008  . SKIN CANCER, HX OF 12/25/2008    Past Surgical History:  Procedure Laterality Date  . KNEE ARTHROSCOPY  1994, 1996   right, left  . NASAL POLYP SURGERY  2004, 2009, 2012   x 4  . SHOULDER ARTHROSCOPY W/ ROTATOR CUFF REPAIR  2005  . TONSILLECTOMY      Family History  Problem Relation Age of Onset  . Alzheimer's disease Father 93  . Cancer Father        melanoma  . Multiple sclerosis Brother     Social History   Socioeconomic History  . Marital status: Divorced    Spouse name: Not on file  . Number of children: 3  . Years of education: Not on file  . Highest education level: Not on file  Occupational History  . 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 Smoking status: Former Smoker    Types:  Cigarettes    Quit date: 11/20/2006    Years since quitting: 13.4  . Smokeless tobacco: Never Used  Vaping Use  . Vaping Use: Never used  Substance and Sexual Activity  . Alcohol use: Yes  . Drug use: No  . Sexual activity: Not on file  Other Topics Concern  . Not on file  Social History Narrative  . Not on file   Social Determinants of Health   Financial Resource Strain:   . Difficulty of Paying Living Expenses: Not on file  Food Insecurity:   . Worried About Charity fundraiser in the Last Year: Not on file  . Ran Out of Food in the Last Year: Not on file  Transportation Needs:   . Lack of Transportation (Medical): Not on file  . Lack of Transportation (Non-Medical): Not on file  Physical Activity:   . Days of Exercise per Week: Not on file  . Minutes of Exercise per Session: Not on file  Stress:   . Feeling of Stress : Not on file  Social Connections:   . Frequency of Communication with Friends and Family: Not on file  . Frequency of Social Gatherings with Friends and Family: Not on file  . Attends  Religious Services: Not on file  . Active Member of Clubs or Organizations: Not on file  . Attends Archivist Meetings: Not on file  . Marital Status: Not on file  Intimate Partner Violence:   . Fear of Current or Ex-Partner: Not on file  . Emotionally Abused: Not on file  . Physically Abused: Not on file  . Sexually Abused: Not on file    Outpatient Medications Prior to Visit  Medication Sig Dispense Refill  . fluticasone (FLONASE) 50 MCG/ACT nasal spray Place 2 sprays into both nostrils 2 (two) times daily.    Marland Kitchen LORazepam (ATIVAN) 0.5 MG tablet Take 1 tablet (0.5 mg total) by mouth every 8 (eight) hours as needed for anxiety. 30 tablet 0  . tadalafil (CIALIS) 20 MG tablet Take one tablet every other day as needed for erectile dysfunction 30 tablet 5  . ID NOW COVID-19 KIT See admin instructions.     No facility-administered medications prior to visit.     Allergies  Allergen Reactions  . Ciprofloxacin Diarrhea    REACTION: GI upset, C-diff  . Metronidazole Hives  . Flagyl [Metronidazole Hcl] Rash    ROS Review of Systems  Constitutional: Negative for appetite change, chills, fatigue, fever and unexpected weight change.  Respiratory: Negative for cough and shortness of breath.   Cardiovascular: Negative for chest pain.  Gastrointestinal: Negative for abdominal pain, nausea and vomiting.  Genitourinary: Negative for dysuria.  Neurological: Positive for dizziness and light-headedness. Negative for tremors, seizures, syncope, speech difficulty, weakness and headaches.  Hematological: Negative for adenopathy.      Objective:    Physical Exam Vitals reviewed.  Constitutional:      Appearance: Normal appearance.  Cardiovascular:     Rate and Rhythm: Normal rate and regular rhythm.     Comments: His pulse was palpated when he went from seated to standing and he did have some symptoms of dizziness then and pulse was very irregular and within normal range.  No tachycardia. Pulmonary:     Effort: Pulmonary effort is normal.     Breath sounds: Normal breath sounds.  Musculoskeletal:     Right lower leg: No edema.     Left lower leg: No edema.  Neurological:     General: No focal deficit present.     Mental Status: He is alert and oriented to person, place, and time.     Cranial Nerves: No cranial nerve deficit.     Motor: No weakness.     Coordination: Coordination normal.     Gait: Gait normal.     BP 110/78   Pulse (!) 104   Temp 98.3 F (36.8 C)   Wt 202 lb 4.8 oz (91.8 kg)   SpO2 93%   BMI 29.87 kg/m  Wt Readings from Last 3 Encounters:  04/30/20 202 lb 4.8 oz (91.8 kg)  04/09/20 201 lb (91.2 kg)  01/30/19 191 lb (86.6 kg)     There are no preventive care reminders to display for this patient.  There are no preventive care reminders to display for this patient.  Lab Results  Component Value Date   TSH  1.05 04/09/2020   Lab Results  Component Value Date   WBC 4.7 04/09/2020   HGB 14.9 04/09/2020   HCT 44.2 04/09/2020   MCV 94.6 04/09/2020   PLT 218 04/09/2020   Lab Results  Component Value Date   NA 139 04/09/2020   K 4.0 04/09/2020   CO2 24 04/09/2020  GLUCOSE 117 (H) 04/09/2020   BUN 17 04/09/2020   CREATININE 0.79 04/09/2020   BILITOT 0.8 04/09/2020   ALKPHOS 73 09/11/2016   AST 23 04/09/2020   ALT 19 04/09/2020   PROT 7.5 04/09/2020   ALBUMIN 4.1 09/11/2016   CALCIUM 9.5 04/09/2020   GFR 111.58 09/11/2016   Lab Results  Component Value Date   CHOL 240 (H) 04/09/2020   Lab Results  Component Value Date   HDL 73 04/09/2020   Lab Results  Component Value Date   LDLCALC 144 (H) 04/09/2020   Lab Results  Component Value Date   TRIG 117 04/09/2020   Lab Results  Component Value Date   CHOLHDL 3.3 04/09/2020   No results found for: HGBA1C    Assessment & Plan:   Problem List Items Addressed This Visit    None    Visit Diagnoses    Dizzy    -  Primary   Relevant Orders   Orthostatic vital signs    Patient relates approximately 10-day history of dizziness which seems to be consistently related to the change of position.  He did have some orthostatic changes here and blood pressure with supine blood pressure 902 systolic and standing this dropped to 110 with seated systolic 284.  Minimal change in pulse.  Does not appear to be clinically dehydrated.  Recent CBC revealed no anemia.  No recent electrolyte disturbance and no symptoms to suggest likely adrenal insufficiency  -We recommend he try to start consuming electrolyte replacement such as Gatorade or G2 early in the morning and really push hydration.  Be in touch if symptoms not resolving over the next few days -Follow-up immediately for any new symptoms  No orders of the defined types were placed in this encounter.   Follow-up: No follow-ups on file.    Carolann Littler, MD

## 2020-04-30 NOTE — Patient Instructions (Addendum)
Try consuming Gatorade or G2 early in the mornings.   Let me know if dizziness not resolving with this.     If you have lab work done today you will be contacted with your lab results within the next 2 weeks.  If you have not heard from [place] then please contact us. The fastest way to get your results is to register for My Chart.   IF you received an x-ray today, you will receive an invoice from [facility]. Please contact [facility] at [phone] with questions or concerns regarding your invoice.   IF you received labwork today, you will receive an invoice from [facility]. Please contact [facility] at [phone] with questions or concerns regarding your invoice.   Our billing staff will not be able to assist you with questions regarding bills from these companies.  You will be contacted with the lab results as soon as they are available. The fastest way to get your results is to activate your My Chart account. Instructions are located on the last page of this paperwork. If you have not heard from [place] regarding the results in 2 weeks, please contact this office.

## 2020-05-15 DIAGNOSIS — D2271 Melanocytic nevi of right lower limb, including hip: Secondary | ICD-10-CM | POA: Diagnosis not present

## 2020-05-15 DIAGNOSIS — L111 Transient acantholytic dermatosis [Grover]: Secondary | ICD-10-CM | POA: Diagnosis not present

## 2020-05-15 DIAGNOSIS — L820 Inflamed seborrheic keratosis: Secondary | ICD-10-CM | POA: Diagnosis not present

## 2020-05-15 DIAGNOSIS — L814 Other melanin hyperpigmentation: Secondary | ICD-10-CM | POA: Diagnosis not present

## 2020-05-15 DIAGNOSIS — L821 Other seborrheic keratosis: Secondary | ICD-10-CM | POA: Diagnosis not present

## 2020-05-15 DIAGNOSIS — D225 Melanocytic nevi of trunk: Secondary | ICD-10-CM | POA: Diagnosis not present

## 2020-05-15 DIAGNOSIS — L218 Other seborrheic dermatitis: Secondary | ICD-10-CM | POA: Diagnosis not present

## 2020-05-15 DIAGNOSIS — L570 Actinic keratosis: Secondary | ICD-10-CM | POA: Diagnosis not present

## 2020-05-29 DIAGNOSIS — Z23 Encounter for immunization: Secondary | ICD-10-CM | POA: Diagnosis not present

## 2020-06-07 ENCOUNTER — Encounter: Payer: Self-pay | Admitting: Family Medicine

## 2020-06-17 DIAGNOSIS — Z1211 Encounter for screening for malignant neoplasm of colon: Secondary | ICD-10-CM | POA: Diagnosis not present

## 2020-06-17 DIAGNOSIS — D120 Benign neoplasm of cecum: Secondary | ICD-10-CM | POA: Diagnosis not present

## 2020-06-17 DIAGNOSIS — K635 Polyp of colon: Secondary | ICD-10-CM | POA: Diagnosis not present

## 2020-06-17 DIAGNOSIS — Z86010 Personal history of colonic polyps: Secondary | ICD-10-CM | POA: Diagnosis not present

## 2020-09-12 DIAGNOSIS — S0181XA Laceration without foreign body of other part of head, initial encounter: Secondary | ICD-10-CM | POA: Diagnosis not present

## 2020-09-12 DIAGNOSIS — W19XXXA Unspecified fall, initial encounter: Secondary | ICD-10-CM | POA: Diagnosis not present

## 2020-09-20 DIAGNOSIS — Z4802 Encounter for removal of sutures: Secondary | ICD-10-CM | POA: Diagnosis not present

## 2020-10-23 DIAGNOSIS — L570 Actinic keratosis: Secondary | ICD-10-CM | POA: Diagnosis not present

## 2020-10-23 DIAGNOSIS — L111 Transient acantholytic dermatosis [Grover]: Secondary | ICD-10-CM | POA: Diagnosis not present

## 2020-11-18 ENCOUNTER — Encounter: Payer: Self-pay | Admitting: Family Medicine

## 2020-12-16 ENCOUNTER — Encounter: Payer: Self-pay | Admitting: Family Medicine

## 2020-12-17 MED ORDER — TADALAFIL 20 MG PO TABS
ORAL_TABLET | ORAL | 5 refills | Status: DC
Start: 2020-12-17 — End: 2022-01-01

## 2020-12-17 NOTE — Telephone Encounter (Signed)
Last filled in 2020. Ok to refill?

## 2021-04-01 DIAGNOSIS — M5442 Lumbago with sciatica, left side: Secondary | ICD-10-CM | POA: Diagnosis not present

## 2021-04-01 DIAGNOSIS — M25562 Pain in left knee: Secondary | ICD-10-CM | POA: Diagnosis not present

## 2021-04-01 DIAGNOSIS — M545 Low back pain, unspecified: Secondary | ICD-10-CM | POA: Diagnosis not present

## 2021-04-10 ENCOUNTER — Ambulatory Visit: Payer: Medicare Other

## 2021-04-11 ENCOUNTER — Encounter: Payer: Self-pay | Admitting: Family Medicine

## 2021-04-14 ENCOUNTER — Telehealth: Payer: Self-pay | Admitting: Family Medicine

## 2021-04-14 NOTE — Telephone Encounter (Signed)
Left message for patient to call back and schedule Medicare Annual Wellness Visit (AWV) either virtually or in office. Left  my Herbie Drape number [phone]   Last AWV 04/09/20 ; please schedule at anytime with [facility] Nurse Health Advisor 1 or 2   This should be a 45 minute visit.

## 2021-04-14 NOTE — Telephone Encounter (Signed)
Please review in Dr. Erick Blinks absence.

## 2021-04-14 NOTE — Telephone Encounter (Signed)
I would go ahead and get the flu shot now. Yes he should get the new bivalent Covid vaccine but I would separate them by at least a week

## 2021-04-16 DIAGNOSIS — Z23 Encounter for immunization: Secondary | ICD-10-CM | POA: Diagnosis not present

## 2021-04-23 ENCOUNTER — Ambulatory Visit (INDEPENDENT_AMBULATORY_CARE_PROVIDER_SITE_OTHER): Payer: Medicare Other

## 2021-04-23 ENCOUNTER — Other Ambulatory Visit: Payer: Self-pay

## 2021-04-23 DIAGNOSIS — Z0000 Encounter for general adult medical examination without abnormal findings: Secondary | ICD-10-CM | POA: Diagnosis not present

## 2021-04-23 NOTE — Patient Instructions (Signed)
Eric [name] , Thank you for taking time to come for your Medicare Wellness Visit. I appreciate your ongoing commitment to your health goals. Please review the following plan we discussed and let me know if I can assist you in the future.   Screening recommendations/referrals: Colonoscopy: Done 01/23/15 repeat every 10 years  Recommended yearly ophthalmology/optometry visit for glaucoma screening and checkup Recommended yearly dental visit for hygiene and checkup  Vaccinations: Influenza vaccine: Due and dicussed Pneumococcal vaccine: Completed  Tdap vaccine: Done 08/08/14 Shingles vaccine: Shingrix discussed. Please contact your pharmacy for coverage information.    Covid-19: Completed 1/23, 2/13, 05/20/20  Advanced directives: Please bring a copy of your health care power of attorney and living will to the office at your convenience.  Conditions/risks identified: None at this Time  Next appointment: Follow up in one year for your annual wellness visit.   Preventive Care 66 Years and Older, Male Preventive care refers to lifestyle choices and visits with your health care provider that can promote health and wellness. What does preventive care include? A yearly physical exam. This is also called an annual well check. Dental exams once or twice a year. Routine eye exams. Ask your health care provider how often you should have your eyes checked. Personal lifestyle choices, including: Daily care of your teeth and gums. Regular physical activity. Eating a healthy diet. Avoiding tobacco and drug use. Limiting alcohol use. Practicing safe sex. Taking low doses of aspirin every day. Taking vitamin and mineral supplements as recommended by your health care provider. What happens during an annual well check? The services and screenings done by your health care provider during your annual well check will depend on your age, overall health, lifestyle risk factors, and family history of  disease. Counseling  Your health care provider may ask you questions about your: Alcohol use. Tobacco use. Drug use. Emotional well-being. Home and relationship well-being. Sexual activity. Eating habits. History of falls. Memory and ability to understand (cognition). Work and work Statistician. Screening  You may have the following tests or measurements: Height, weight, and BMI. Blood pressure. Lipid and cholesterol levels. These may be checked every 5 years, or more frequently if you are over 67 years old. Skin check. Lung cancer screening. You may have this screening every year starting at age 77 if you have a 30-pack-year history of smoking and currently smoke or have quit within the past 15 years. Fecal occult blood test (FOBT) of the stool. You may have this test every year starting at age 35. Flexible sigmoidoscopy or colonoscopy. You may have a sigmoidoscopy every 5 years or a colonoscopy every 10 years starting at age 94. Prostate cancer screening. Recommendations will vary depending on your family history and other risks. Hepatitis C blood test. Hepatitis B blood test. Sexually transmitted disease (STD) testing. Diabetes screening. This is done by checking your blood sugar (glucose) after you have not eaten for a while (fasting). You may have this done every 1-3 years. Abdominal aortic aneurysm (AAA) screening. You may need this if you are a current or former smoker. Osteoporosis. You may be screened starting at age 8 if you are at high risk. Talk with your health care provider about your test results, treatment options, and if necessary, the need for more tests. Vaccines  Your health care provider may recommend certain vaccines, such as: Influenza vaccine. This is recommended every year. Tetanus, diphtheria, and acellular pertussis (Tdap, Td) vaccine. You may need a Td booster every 10 years.  Zoster vaccine. You may need this after age 60. Pneumococcal 13-valent  conjugate (PCV13) vaccine. One dose is recommended after age 65. Pneumococcal polysaccharide (PPSV23) vaccine. One dose is recommended after age 97. Talk to your health care provider about which screenings and vaccines you need and how often you need them. This information is not intended to replace advice given to you by your health care provider. Make sure you discuss any questions you have with your health care provider. Document Released: 08/02/2015 Document Revised: 03/25/2016 Document Reviewed: 05/07/2015 Elsevier Interactive Patient Education  2017 [place] Prevention in the Home Falls can cause injuries. They can happen to people of all ages. There are many things you can do to make your home safe and to help prevent falls. What can I do on the outside of my home? Regularly fix the edges of walkways and driveways and fix any cracks. Remove anything that might make you trip as you walk through a door, such as a raised step or threshold. Trim any bushes or trees on the path to your home. Use bright outdoor lighting. Clear any walking paths of anything that might make someone trip, such as rocks or tools. Regularly check to see if handrails are loose or broken. Make sure that both sides of any steps have handrails. Any raised decks and porches should have guardrails on the edges. Have any leaves, snow, or ice cleared regularly. Use sand or salt on walking paths during winter. Clean up any spills in your garage right away. This includes oil or grease spills. What can I do in the bathroom? Use night lights. Install grab bars by the toilet and in the tub and shower. Do not use towel bars as grab bars. Use non-skid mats or decals in the tub or shower. If you need to sit down in the shower, use a plastic, non-slip stool. Keep the floor dry. Clean up any water that spills on the floor as soon as it happens. Remove soap buildup in the tub or shower regularly. Attach bath mats  securely with double-sided non-slip rug tape. Do not have throw rugs and other things on the floor that can make you trip. What can I do in the bedroom? Use night lights. Make sure that you have a light by your bed that is easy to reach. Do not use any sheets or blankets that are too big for your bed. They should not hang down onto the floor. Have a firm chair that has side arms. You can use this for support while you get dressed. Do not have throw rugs and other things on the floor that can make you trip. What can I do in the kitchen? Clean up any spills right away. Avoid walking on wet floors. Keep items that you use a lot in easy-to-reach places. If you need to reach something above you, use a strong step stool that has a grab bar. Keep electrical cords out of the way. Do not use floor polish or wax that makes floors slippery. If you must use wax, use non-skid floor wax. Do not have throw rugs and other things on the floor that can make you trip. What can I do with my stairs? Do not leave any items on the stairs. Make sure that there are handrails on both sides of the stairs and use them. Fix handrails that are broken or loose. Make sure that handrails are as long as the stairways. Check any carpeting to make sure that  it is firmly attached to the stairs. Fix any carpet that is loose or worn. Avoid having throw rugs at the top or bottom of the stairs. If you do have throw rugs, attach them to the floor with carpet tape. Make sure that you have a light switch at the top of the stairs and the bottom of the stairs. If you do not have them, ask someone to add them for you. What else can I do to help prevent falls? Wear shoes that: Do not have high heels. Have rubber bottoms. Are comfortable and fit you well. Are closed at the toe. Do not wear sandals. If you use a stepladder: Make sure that it is fully opened. Do not climb a closed stepladder. Make sure that both sides of the stepladder  are locked into place. Ask someone to hold it for you, if possible. Clearly mark and make sure that you can see: Any grab bars or handrails. First and last steps. Where the edge of each step is. Use tools that help you move around (mobility aids) if they are needed. These include: Canes. Walkers. Scooters. Crutches. Turn on the lights when you go into a dark area. Replace any light bulbs as soon as they burn out. Set up your furniture so you have a clear path. Avoid moving your furniture around. If any of your floors are uneven, fix them. If there are any pets around you, be aware of where they are. Review your medicines with your doctor. Some medicines can make you feel dizzy. This can increase your chance of falling. Ask your doctor what other things that you can do to help prevent falls. This information is not intended to replace advice given to you by your health care provider. Make sure you discuss any questions you have with your health care provider. Document Released: 05/02/2009 Document Revised: 12/12/2015 Document Reviewed: 08/10/2014 Elsevier Interactive Patient Education  2017 [employer].

## 2021-04-23 NOTE — Progress Notes (Signed)
Virtual Visit via Telephone Note  I connected with  Eric [name] on 04/23/21 at  3:15 PM EDT by telephone and verified that I am speaking with the correct person using two identifiers.  Location: Patient: Home Provider: Office Persons participating in the virtual visit: patient/Nurse Health Advisor   I discussed the limitations, risks, security and privacy concerns of performing an evaluation and management service by telephone and the availability of in person appointments. The patient expressed understanding and agreed to proceed.  Interactive audio and video telecommunications were attempted between this nurse and patient, however failed, due to patient having technical difficulties OR patient did not have access to video capability.  We continued and completed visit with audio only.  Some vital signs may be absent or patient reported.   Willette Brace, LPN   Subjective:   Eric [name] is a 70 y.o. male who presents for Medicare Annual/Subsequent preventive examination.  Review of Systems     Cardiac Risk Factors include: advanced age (>31men, >61 women);male gender     Objective:    Today's Vitals   04/23/21 1518  PainSc: 8    There is no height or weight on file to calculate BMI.  Advanced Directives 04/23/2021  Does Patient Have a Medical Advance Directive? Yes  Type of Advance Directive [place] in Chart? No - copy requested    Current Medications (verified) Outpatient Encounter Medications as of 04/23/2021  Medication Sig   Cetirizine HCl 10 MG CAPS    fluticasone (FLONASE) 50 MCG/ACT nasal spray Place 2 sprays into both nostrils 2 (two) times daily.   HYDROcodone-acetaminophen (NORCO/VICODIN) 5-325 MG tablet Take 1 tablet by mouth every 6 (six) hours as needed for moderate pain.   tadalafil (CIALIS) 20 MG tablet Take one tablet every other day as needed for erectile dysfunction   LORazepam  (ATIVAN) 0.5 MG tablet Take 1 tablet (0.5 mg total) by mouth every 8 (eight) hours as needed for anxiety. (Patient not taking: Reported on 04/23/2021)   No facility-administered encounter medications on file as of 04/23/2021.    Allergies (verified) Ciprofloxacin, Metronidazole, and Flagyl [metronidazole hcl]   History: Past Medical History:  Diagnosis Date   Anal fissure    Chicken pox    DEPRESSION 12/25/2008   Dyslipidemia    LIBIDO, DECREASED 12/25/2008   SKIN CANCER, HX OF 12/25/2008   Past Surgical History:  Procedure Laterality Date   KNEE ARTHROSCOPY  1994, 1996   right, left   NASAL POLYP SURGERY  2004, 2009, 2012   x 4   SHOULDER ARTHROSCOPY W/ ROTATOR CUFF REPAIR  2005   TONSILLECTOMY     Family History  Problem Relation Age of Onset   Alzheimer's disease Father 80   Cancer Father        melanoma   Multiple sclerosis Brother    Social History   Socioeconomic History   Marital status: Divorced    Spouse name: Not on file   Number of children: 3   Years of education: Not on file   Highest education level: Not on file  Occupational History   Occupation: Therapist, music    Employer: [employer]  Tobacco Use   Smoking status: Former    Types: Cigarettes    Quit date: 11/20/2006    Years since quitting: 14.4   Smokeless tobacco: Never  Vaping Use   Vaping Use: Never used  Substance and Sexual Activity  Alcohol use: Yes   Drug use: No   Sexual activity: Not on file  Other Topics Concern   Not on file  Social History Narrative   Not on file   Social Determinants of Health   Financial Resource Strain: Low Risk    Difficulty of Paying Living Expenses: Not hard at all  Food Insecurity: No Food Insecurity   Worried About Running Out of Food in the Last Year: Never true   [place] in the Last Year: Never true  Transportation Needs: No Transportation Needs   Lack of Transportation (Medical): No   Lack of Transportation (Non-Medical): No   Physical Activity: Sufficiently Active   Days of Exercise per Week: 5 days   Minutes of Exercise per Session: 120 min  Stress: No Stress Concern Present   Feeling of Stress : Not at all  Social Connections: Socially Isolated   Frequency of Communication with Friends and Family: Twice a week   Frequency of Social Gatherings with Friends and Family: More than three times a week   Attends Religious Services: Never   Marine scientist or Organizations: No   Attends Music therapist: Never   Marital Status: Divorced    Tobacco Counseling Counseling given: Not Answered   Clinical Intake:  Pre-visit preparation completed: Yes  Pain : 0-10 Pain Score: 8  Pain Type: Chronic pain Pain Location: Back (siatica) Pain Descriptors / Indicators: Radiating, Shooting Pain Onset: More than a month ago Pain Frequency: Intermittent     BMI - recorded: 29.87 Nutritional Status: BMI 25 -29 Overweight Nutritional Risks: None Diabetes: No  How often do you need to have someone help you when you read instructions, pamphlets, or other written materials from your doctor or pharmacy?: 1 - Never  Diabetic?no  Interpreter Needed?: No  Information entered by :: Charlott Rakes, LPN   Activities of Daily Living In your present state of health, do you have any difficulty performing the following activities: 04/23/2021  Hearing? Y  Comment wears hearing aids  Vision? N  Difficulty concentrating or making decisions? N  Walking or climbing stairs? N  Dressing or bathing? N  Doing errands, shopping? N  Preparing Food and eating ? N  Using the Toilet? N  In the past six months, have you accidently leaked urine? N  Do you have problems with loss of bowel control? N  Managing your Medications? N  Managing your Finances? N  Housekeeping or managing your Housekeeping? N  Some recent data might be hidden    Patient Care Team: [name], MD as PCP - General  Indicate  any recent Medical Services you may have received from other than [facility] providers in the past year (date may be approximate).     Assessment:   This is a routine wellness examination for [facility].  Hearing/Vision screen Hearing Screening - Comments:: Pt wears hearing aids  Vision Screening - Comments:: Encouraged to follow up with annual eye exams   Dietary issues and exercise activities discussed: Current Exercise Habits: Home exercise routine, Type of exercise: Other - see comments;strength training/weights, Time (Minutes): > 60, Frequency (Times/Week): 5, Weekly Exercise (Minutes/Week): 0   Goals Addressed             This Visit's Progress    Patient Stated       None at this time       Depression Screen PHQ 2/9 Scores 04/23/2021  PHQ - 2 Score 0  Fall Risk Fall Risk  04/23/2021  Falls in the past year? 1  Number falls in past yr: 1  Injury with Fall? 1  Comment 5 stitches in chin  Risk for fall due to : Impaired vision  Follow up Falls prevention discussed    FALL RISK PREVENTION PERTAINING TO THE HOME:  Any stairs in or around the home? Yes  If so, are there any without handrails? Yes  Home free of loose throw rugs in walkways, pet beds, electrical cords, etc? Yes  Adequate lighting in your home to reduce risk of falls? Yes   ASSISTIVE DEVICES UTILIZED TO PREVENT FALLS:  Life alert? No  Use of a cane, walker or w/c? No  Grab bars in the bathroom? No  Shower chair or bench in shower? No  Elevated toilet seat or a handicapped toilet? No   TIMED UP AND GO:  Was the test performed? No .  Cognitive Function:     6CIT Screen 04/23/2021  What Year? 0 points  What month? 0 points  What time? 0 points  Count back from 20 0 points  Months in reverse 0 points  Repeat phrase 0 points  Total Score 0    Immunizations Immunization History  Administered Date(s) Administered   Hepatitis A 03/15/2009, 09/20/2009   Influenza Split 05/30/2014   Influenza Whole  07/09/2010, 04/15/2012   Influenza, High Dose Seasonal PF 04/26/2019   Influenza-Unspecified 03/20/2016, 04/19/2019, 04/18/2021   PFIZER(Purple Top)SARS-COV-2 Vaccination 08/12/2019, 09/02/2019, 05/30/2020   Pneumococcal Conjugate-13 07/01/2018, 01/23/2019   Pneumococcal Polysaccharide-23 04/09/2020   Td 07/21/2007, 05/21/2008    TDAP status: Up to date  Flu Vaccine status: Due, Education has been provided regarding the importance of this vaccine. Advised may receive this vaccine at local pharmacy or Health Dept. Aware to provide a copy of the vaccination record if obtained from local pharmacy or Health Dept. Verbalized acceptance and understanding.  Pneumococcal vaccine status: Up to date  Covid-19 vaccine status: Completed vaccines  Qualifies for Shingles Vaccine? Yes   Zostavax completed No   Shingrix Completed?: No.    Education has been provided regarding the importance of this vaccine. Patient has been advised to call insurance company to determine out of pocket expense if they have not yet received this vaccine. Advised may also receive vaccine at local pharmacy or Health Dept. Verbalized acceptance and understanding.  Screening Tests Health Maintenance  Topic Date Due   Zoster Vaccines- Shingrix (1 of 2) Never done   COVID-19 Vaccine (4 - Booster for Pfizer series) 08/22/2020   TETANUS/TDAP  08/08/2024   COLONOSCOPY (Pts 45-57yrs Insurance coverage will need to be confirmed)  01/22/2025   INFLUENZA VACCINE  Completed   Hepatitis C Screening  Completed   HPV VACCINES  Aged Out    Health Maintenance  Health Maintenance Due  Topic Date Due   Zoster Vaccines- Shingrix (1 of 2) Never done   COVID-19 Vaccine (4 - Booster for Pfizer series) 08/22/2020    Colorectal cancer screening: Type of screening: Colonoscopy. Completed 01/23/15. Repeat every 10 years   Additional Screening:  Hepatitis C Screening: Completed 09/11/16  Vision Screening: Recommended annual  ophthalmology exams for early detection of glaucoma and other disorders of the eye. Is the patient up to date with their annual eye exam?  No  Who is the provider or what is the name of the office in which the patient attends annual eye exams? Encouraged to follow up with provider  If pt is not established  with a provider, would they like to be referred to a provider to establish care? No .   Dental Screening: Recommended annual dental exams for proper oral hygiene  Community Resource Referral / Chronic Care Management: CRR required this visit?  No   CCM required this visit?  No      Plan:     I have personally reviewed and noted the following in the patient's chart:   Medical and social history Use of alcohol, tobacco or illicit drugs  Current medications and supplements including opioid prescriptions. Patient is currently taking opioid prescriptions. Information provided to patient regarding non-opioid alternatives. Patient advised to discuss non-opioid treatment plan with their provider. Functional ability and status Nutritional status Physical activity Advanced directives List of other physicians Hospitalizations, surgeries, and ER visits in previous 12 months Vitals Screenings to include cognitive, depression, and falls Referrals and appointments  In addition, I have reviewed and discussed with patient certain preventive protocols, quality metrics, and best practice recommendations. A written personalized care plan for preventive services as well as general preventive health recommendations were provided to patient.     Willette Brace, LPN   [id number]   Nurse Notes: None

## 2021-04-24 ENCOUNTER — Other Ambulatory Visit: Payer: Self-pay | Admitting: Orthopedic Surgery

## 2021-04-24 DIAGNOSIS — M545 Low back pain, unspecified: Secondary | ICD-10-CM

## 2021-04-26 ENCOUNTER — Ambulatory Visit
Admission: RE | Admit: 2021-04-26 | Discharge: 2021-04-26 | Disposition: A | Discharge status: Home / Self Care | Payer: Medicare Other | Source: Ambulatory Visit | Attending: Orthopedic Surgery | Admitting: Orthopedic Surgery

## 2021-04-26 DIAGNOSIS — M545 Low back pain, unspecified: Secondary | ICD-10-CM

## 2021-04-26 DIAGNOSIS — M48061 Spinal stenosis, lumbar region without neurogenic claudication: Secondary | ICD-10-CM | POA: Diagnosis not present

## 2021-04-29 DIAGNOSIS — M5442 Lumbago with sciatica, left side: Secondary | ICD-10-CM | POA: Diagnosis not present

## 2021-04-30 ENCOUNTER — Other Ambulatory Visit: Payer: Medicare Other

## 2021-05-05 DIAGNOSIS — M5416 Radiculopathy, lumbar region: Secondary | ICD-10-CM | POA: Diagnosis not present

## 2021-05-15 ENCOUNTER — Telehealth (INDEPENDENT_AMBULATORY_CARE_PROVIDER_SITE_OTHER): Payer: Medicare Other | Admitting: Family Medicine

## 2021-05-15 DIAGNOSIS — A084 Viral intestinal infection, unspecified: Secondary | ICD-10-CM

## 2021-05-15 DIAGNOSIS — Z20822 Contact with and (suspected) exposure to covid-19: Secondary | ICD-10-CM | POA: Diagnosis not present

## 2021-05-15 NOTE — Progress Notes (Signed)
Virtual Visit via Telephone Note Contacted pt for virtual visit, however computer prompted him to restart the computer in order to allow access to his camera via google chrome. Pt did not want to miss the appt, so he did not restart the system.  Visit was audio only for pt, but he could see and hear this provider.  I connected with Eric [name] on 05/15/21 at  1:30 PM EDT by telephone and verified that I am speaking with the correct person using two identifiers.   I discussed the limitations, risks, security and privacy concerns of performing an evaluation and management service by telephone and the availability of in person appointments. I also discussed with the patient that there may be a patient responsible charge related to this service. The patient expressed understanding and agreed to proceed.  Location patient: home Location provider: work or home office Participants present for the call: patient, provider Chief Complaint  Patient presents with   Headache    Is complaining of being bloated, nauseous, off balance, teeth pain, not being able to eat till 5 pm yesterday and only ate 1/4 of his food. Also stated he did not sleep 2 minutes last night      History of Present Illness: Pt is a 70 yo male seen for acute concern.  Pt states he felt "terrible" yesterday.  Had HA, abd cramping/gas, n, balance felt off, and loose stools  x 3.  Abd pain was 8/10, now 4/10.  Pt went to [facility] walk in clinic this am.  COVID and Influenza testing negative. Was advised to go to ED by the [facility] provider, but was not seen by the provider.  Denies fever, sick contacts, eating at restaurants, changes in foods, being in crowds of people, recent antibiotic use.  Took tylenol and gas X for symptoms.   Observations/Objective: Patient sounds cheerful and well on the phone. I do not appreciate any SOB. Speech and thought processing are grossly intact. Patient reported vitals:  Assessment and Plan: Viral  gastroenteritis -improving. -Also consider diverticulitis for increased or worsened abdominal pain.  Next colonoscopy due 01/22/2025. -Discussed supportive care including rest, hydration, treatment of symptoms -Okay to continue Gas-X as needed -Can also use Imodium as needed for loose stools -Bland diet, advance as tolerated -Given strict precautions   Follow Up Instructions: F/u prn for continued or worsened symptoms  [id number] [id number] [id number] 11-20 [id number] 21-30 I did not refer this patient for an OV in the next 24 hours for this/these issue(s).  I discussed the assessment and treatment plan with the patient. The patient was provided an opportunity to ask questions and all were answered. The patient agreed with the plan and demonstrated an understanding of the instructions.   The patient was advised to call back or seek an in-person evaluation if the symptoms worsen or if the condition fails to improve as anticipated.  I provided 11 minutes of non-face-to-face time during this encounter.   Billie Ruddy, MD

## 2021-05-16 ENCOUNTER — Other Ambulatory Visit: Payer: Self-pay

## 2021-05-16 ENCOUNTER — Ambulatory Visit (INDEPENDENT_AMBULATORY_CARE_PROVIDER_SITE_OTHER): Payer: Medicare Other | Admitting: Family Medicine

## 2021-05-16 VITALS — BP 140/82 | HR 91 | Temp 97.5°F | Wt 201.3 lb

## 2021-05-16 DIAGNOSIS — R2689 Other abnormalities of gait and mobility: Secondary | ICD-10-CM | POA: Diagnosis not present

## 2021-05-16 DIAGNOSIS — A084 Viral intestinal infection, unspecified: Secondary | ICD-10-CM

## 2021-05-16 NOTE — Progress Notes (Signed)
Established Patient Office Visit  Subjective:  Patient ID: Eric [name], male    DOB: 07/21/1950  Age: 70 y.o. MRN: [id number]  CC:  Chief Complaint  Patient presents with   Headache    Symptoms started Wednesday, bloating, balance is off, jaw and teeth pain, headache, nausea, negative for covid last Thursday, negative for the flu over the weekend, saw Dr. Volanda Napoleon virtually, some improvement but still not feeling well.    HPI Eric [name] presents for recent acute illness.  He states that couple days ago he woke up with some abdominal bloating and headache and nonbloody diarrhea.  He had about 3 episodes of diarrhea Wednesday.  No localizing abdominal pain.  He had fairly severe nausea but no vomiting.  He ended up going to urgent care on Thursday and had COVID testing and influenza which were both negative.  He does feel about 50% improved today.  Nausea resolved.  Diarrhea resolved.  He has not eaten much at this point.  Headache improved.  No dysuria.  No known sick contacts.  He states in general recently has had some issues with feeling his balance is "off ".  This is usually related to sudden movement.  He has a brother 5 years younger who was recently diagnosed with Parkinson's disease.  He has not noted any tremor.  No ataxia.  No focal weakness.  No persistent headaches.  Past Medical History:  Diagnosis Date   Anal fissure    Chicken pox    DEPRESSION 12/25/2008   Dyslipidemia    LIBIDO, DECREASED 12/25/2008   SKIN CANCER, HX OF 12/25/2008    Past Surgical History:  Procedure Laterality Date   KNEE ARTHROSCOPY  1994, 1996   right, left   NASAL POLYP SURGERY  2004, 2009, 2012   x 4   SHOULDER ARTHROSCOPY W/ ROTATOR CUFF REPAIR  2005   TONSILLECTOMY      Family History  Problem Relation Age of Onset   Alzheimer's disease Father 58   Cancer Father        melanoma   Multiple sclerosis Brother     Social History   Socioeconomic History   Marital status: Divorced     Spouse name: Not on file   Number of children: 3   Years of education: Not on file   Highest education level: Master's degree (e.g., MA, MS, MEng, MEd, MSW, MBA)  Occupational History   Occupation: Production designer, theatre/television/film: [employer]  Tobacco Use   Smoking status: Former    Types: Cigarettes    Quit date: 11/20/2006    Years since quitting: 14.4   Smokeless tobacco: Never  Vaping Use   Vaping Use: Never used  Substance and Sexual Activity   Alcohol use: Yes   Drug use: No   Sexual activity: Not on file  Other Topics Concern   Not on file  Social History Narrative   Not on file   Social Determinants of Health   Financial Resource Strain: Low Risk    Difficulty of Paying Living Expenses: Not hard at all  Food Insecurity: No Food Insecurity   Worried About Charity fundraiser in the Last Year: Never true   [place] in the Last Year: Never true  Transportation Needs: No Transportation Needs   Lack of Transportation (Medical): No   Lack of Transportation (Non-Medical): No  Physical Activity: Sufficiently Active   Days of Exercise per Week: 6 days  Minutes of Exercise per Session: 150+ min  Stress: No Stress Concern Present   Feeling of Stress : Only a little  Social Connections: Moderately Integrated   Frequency of Communication with Friends and Family: More than three times a week   Frequency of Social Gatherings with Friends and Family: More than three times a week   Attends Religious Services: 1 to 4 times per year   Active Member of [employer] or Organizations: Yes   Attends Music therapist: More than 4 times per year   Marital Status: Divorced  Human resources officer Violence: Not At Risk   Fear of Current or Ex-Partner: No   Emotionally Abused: No   Physically Abused: No   Sexually Abused: No    Outpatient Medications Prior to Visit  Medication Sig Dispense Refill   Cetirizine HCl 10 MG CAPS      fluticasone (FLONASE) 50 MCG/ACT nasal  spray Place 2 sprays into both nostrils 2 (two) times daily.     HYDROcodone-acetaminophen (NORCO/VICODIN) 5-325 MG tablet Take 1 tablet by mouth every 6 (six) hours as needed for moderate pain.     LORazepam (ATIVAN) 0.5 MG tablet Take 1 tablet (0.5 mg total) by mouth every 8 (eight) hours as needed for anxiety. 30 tablet 0   tadalafil (CIALIS) 20 MG tablet Take one tablet every other day as needed for erectile dysfunction 30 tablet 5   No facility-administered medications prior to visit.    Allergies  Allergen Reactions   Ciprofloxacin Diarrhea    REACTION: GI upset, C-diff   Metronidazole Hives   Flagyl [Metronidazole Hcl] Rash    ROS Review of Systems  Constitutional:  Positive for fatigue. Negative for chills.  Respiratory:  Negative for cough and shortness of breath.   Cardiovascular:  Negative for chest pain.  Gastrointestinal:  Negative for abdominal pain.       See HPI     Objective:    Physical Exam Vitals reviewed.  Cardiovascular:     Rate and Rhythm: Normal rate and regular rhythm.  Pulmonary:     Effort: Pulmonary effort is normal.     Breath sounds: Normal breath sounds.  Abdominal:     General: Bowel sounds are normal.     Palpations: Abdomen is soft.     Tenderness: There is no abdominal tenderness. There is no guarding.     Comments: Ventral hernia which is soft and nontender  Neurological:     Mental Status: He is alert.     Cranial Nerves: No cranial nerve deficit.     Comments: No focal weakness.  Romberg negative. Normal finger-to-nose testing    BP 140/82 (BP Location: Left Arm, Patient Position: Sitting, Cuff Size: Normal)   Pulse 91   Temp (!) 97.5 F (36.4 C) (Oral)   Wt 201 lb 4.8 oz (91.3 kg)   SpO2 98%   BMI 29.73 kg/m  Wt Readings from Last 3 Encounters:  05/16/21 201 lb 4.8 oz (91.3 kg)  04/30/20 202 lb 4.8 oz (91.8 kg)  04/09/20 201 lb (91.2 kg)     Health Maintenance Due  Topic Date Due   Zoster Vaccines- Shingrix (1 of  2) Never done   COVID-19 Vaccine (4 - Booster for Pfizer series) 07/25/2020    There are no preventive care reminders to display for this patient.  Lab Results  Component Value Date   TSH 1.05 04/09/2020   Lab Results  Component Value Date   WBC 4.7 04/09/2020  HGB 14.9 04/09/2020   HCT 44.2 04/09/2020   MCV 94.6 04/09/2020   PLT 218 04/09/2020   Lab Results  Component Value Date   NA 139 04/09/2020   K 4.0 04/09/2020   CO2 24 04/09/2020   GLUCOSE 117 (H) 04/09/2020   BUN 17 04/09/2020   CREATININE 0.79 04/09/2020   BILITOT 0.8 04/09/2020   ALKPHOS 73 09/11/2016   AST 23 04/09/2020   ALT 19 04/09/2020   PROT 7.5 04/09/2020   ALBUMIN 4.1 09/11/2016   CALCIUM 9.5 04/09/2020   GFR 111.58 09/11/2016   Lab Results  Component Value Date   CHOL 240 (H) 04/09/2020   Lab Results  Component Value Date   HDL 73 04/09/2020   Lab Results  Component Value Date   LDLCALC 144 (H) 04/09/2020   Lab Results  Component Value Date   TRIG 117 04/09/2020   Lab Results  Component Value Date   CHOLHDL 3.3 04/09/2020   No results found for: HGBA1C    Assessment & Plan:   Probable resolving viral gastroenteritis.  Patient is improving.  Diarrhea and nausea have resolved.  -Recommend observant management at this time.  We did discuss appropriate diet.  Avoid high-fat and high glycemic foods for now.  Focus on good hydration.  If not fully improved by early next week be in touch and sooner for any worsening symptoms  Suspect he has some vestibular issues related to his balance intermittently.  Nonfocal exam at this time.  He does not have any ataxia, speech change, dysphagia, or other red flag symptoms.  We reviewed signs and symptoms to be on the look out for  No orders of the defined types were placed in this encounter.   Follow-up: No follow-ups on file.    Carolann Littler, MD

## 2021-05-28 DIAGNOSIS — L570 Actinic keratosis: Secondary | ICD-10-CM | POA: Diagnosis not present

## 2021-05-28 DIAGNOSIS — L821 Other seborrheic keratosis: Secondary | ICD-10-CM | POA: Diagnosis not present

## 2021-05-28 DIAGNOSIS — D2271 Melanocytic nevi of right lower limb, including hip: Secondary | ICD-10-CM | POA: Diagnosis not present

## 2021-05-28 DIAGNOSIS — M5416 Radiculopathy, lumbar region: Secondary | ICD-10-CM | POA: Diagnosis not present

## 2021-05-28 DIAGNOSIS — D1801 Hemangioma of skin and subcutaneous tissue: Secondary | ICD-10-CM | POA: Diagnosis not present

## 2021-05-28 DIAGNOSIS — D3612 Benign neoplasm of peripheral nerves and autonomic nervous system, upper limb, including shoulder: Secondary | ICD-10-CM | POA: Diagnosis not present

## 2021-05-28 DIAGNOSIS — D485 Neoplasm of uncertain behavior of skin: Secondary | ICD-10-CM | POA: Diagnosis not present

## 2021-05-28 DIAGNOSIS — L814 Other melanin hyperpigmentation: Secondary | ICD-10-CM | POA: Diagnosis not present

## 2021-05-28 DIAGNOSIS — L720 Epidermal cyst: Secondary | ICD-10-CM | POA: Diagnosis not present

## 2021-05-28 DIAGNOSIS — D225 Melanocytic nevi of trunk: Secondary | ICD-10-CM | POA: Diagnosis not present

## 2021-06-23 DIAGNOSIS — M5416 Radiculopathy, lumbar region: Secondary | ICD-10-CM | POA: Diagnosis not present

## 2021-07-09 DIAGNOSIS — M5416 Radiculopathy, lumbar region: Secondary | ICD-10-CM | POA: Diagnosis not present

## 2021-07-20 HISTORY — PX: OTHER SURGICAL HISTORY: SHX169

## 2021-07-28 DIAGNOSIS — M5416 Radiculopathy, lumbar region: Secondary | ICD-10-CM | POA: Diagnosis not present

## 2021-08-06 DIAGNOSIS — M9905 Segmental and somatic dysfunction of pelvic region: Secondary | ICD-10-CM | POA: Diagnosis not present

## 2021-08-06 DIAGNOSIS — M9906 Segmental and somatic dysfunction of lower extremity: Secondary | ICD-10-CM | POA: Diagnosis not present

## 2021-08-06 DIAGNOSIS — R531 Weakness: Secondary | ICD-10-CM | POA: Diagnosis not present

## 2021-08-06 DIAGNOSIS — M25552 Pain in left hip: Secondary | ICD-10-CM | POA: Diagnosis not present

## 2021-08-06 DIAGNOSIS — M9904 Segmental and somatic dysfunction of sacral region: Secondary | ICD-10-CM | POA: Diagnosis not present

## 2021-08-06 DIAGNOSIS — M5451 Vertebrogenic low back pain: Secondary | ICD-10-CM | POA: Diagnosis not present

## 2021-08-06 DIAGNOSIS — M4306 Spondylolysis, lumbar region: Secondary | ICD-10-CM | POA: Diagnosis not present

## 2021-08-06 DIAGNOSIS — M9903 Segmental and somatic dysfunction of lumbar region: Secondary | ICD-10-CM | POA: Diagnosis not present

## 2021-08-22 DIAGNOSIS — M4306 Spondylolysis, lumbar region: Secondary | ICD-10-CM | POA: Diagnosis not present

## 2021-08-22 DIAGNOSIS — M9905 Segmental and somatic dysfunction of pelvic region: Secondary | ICD-10-CM | POA: Diagnosis not present

## 2021-08-22 DIAGNOSIS — M9903 Segmental and somatic dysfunction of lumbar region: Secondary | ICD-10-CM | POA: Diagnosis not present

## 2021-08-22 DIAGNOSIS — M25552 Pain in left hip: Secondary | ICD-10-CM | POA: Diagnosis not present

## 2021-08-22 DIAGNOSIS — M9908 Segmental and somatic dysfunction of rib cage: Secondary | ICD-10-CM | POA: Diagnosis not present

## 2021-08-22 DIAGNOSIS — M5451 Vertebrogenic low back pain: Secondary | ICD-10-CM | POA: Diagnosis not present

## 2021-08-22 DIAGNOSIS — R531 Weakness: Secondary | ICD-10-CM | POA: Diagnosis not present

## 2021-08-22 DIAGNOSIS — M9906 Segmental and somatic dysfunction of lower extremity: Secondary | ICD-10-CM | POA: Diagnosis not present

## 2021-12-31 ENCOUNTER — Other Ambulatory Visit: Payer: Self-pay | Admitting: Family Medicine

## 2022-01-03 ENCOUNTER — Encounter: Payer: Self-pay | Admitting: Family Medicine

## 2022-01-11 DIAGNOSIS — H6123 Impacted cerumen, bilateral: Secondary | ICD-10-CM | POA: Diagnosis not present

## 2022-02-03 DIAGNOSIS — M79605 Pain in left leg: Secondary | ICD-10-CM | POA: Diagnosis not present

## 2022-02-03 DIAGNOSIS — M9903 Segmental and somatic dysfunction of lumbar region: Secondary | ICD-10-CM | POA: Diagnosis not present

## 2022-02-03 DIAGNOSIS — M9901 Segmental and somatic dysfunction of cervical region: Secondary | ICD-10-CM | POA: Diagnosis not present

## 2022-02-03 DIAGNOSIS — M5416 Radiculopathy, lumbar region: Secondary | ICD-10-CM | POA: Diagnosis not present

## 2022-02-04 DIAGNOSIS — M79605 Pain in left leg: Secondary | ICD-10-CM | POA: Diagnosis not present

## 2022-02-04 DIAGNOSIS — M5416 Radiculopathy, lumbar region: Secondary | ICD-10-CM | POA: Diagnosis not present

## 2022-02-04 DIAGNOSIS — M9903 Segmental and somatic dysfunction of lumbar region: Secondary | ICD-10-CM | POA: Diagnosis not present

## 2022-02-04 DIAGNOSIS — M9901 Segmental and somatic dysfunction of cervical region: Secondary | ICD-10-CM | POA: Diagnosis not present

## 2022-02-05 DIAGNOSIS — M9901 Segmental and somatic dysfunction of cervical region: Secondary | ICD-10-CM | POA: Diagnosis not present

## 2022-02-05 DIAGNOSIS — M79605 Pain in left leg: Secondary | ICD-10-CM | POA: Diagnosis not present

## 2022-02-05 DIAGNOSIS — M9903 Segmental and somatic dysfunction of lumbar region: Secondary | ICD-10-CM | POA: Diagnosis not present

## 2022-02-05 DIAGNOSIS — M5416 Radiculopathy, lumbar region: Secondary | ICD-10-CM | POA: Diagnosis not present

## 2022-02-09 DIAGNOSIS — M79605 Pain in left leg: Secondary | ICD-10-CM | POA: Diagnosis not present

## 2022-02-09 DIAGNOSIS — M9901 Segmental and somatic dysfunction of cervical region: Secondary | ICD-10-CM | POA: Diagnosis not present

## 2022-02-09 DIAGNOSIS — M5416 Radiculopathy, lumbar region: Secondary | ICD-10-CM | POA: Diagnosis not present

## 2022-02-09 DIAGNOSIS — M9903 Segmental and somatic dysfunction of lumbar region: Secondary | ICD-10-CM | POA: Diagnosis not present

## 2022-02-11 DIAGNOSIS — M9903 Segmental and somatic dysfunction of lumbar region: Secondary | ICD-10-CM | POA: Diagnosis not present

## 2022-02-11 DIAGNOSIS — M5416 Radiculopathy, lumbar region: Secondary | ICD-10-CM | POA: Diagnosis not present

## 2022-02-11 DIAGNOSIS — M9901 Segmental and somatic dysfunction of cervical region: Secondary | ICD-10-CM | POA: Diagnosis not present

## 2022-02-11 DIAGNOSIS — M79605 Pain in left leg: Secondary | ICD-10-CM | POA: Diagnosis not present

## 2022-02-12 DIAGNOSIS — M9903 Segmental and somatic dysfunction of lumbar region: Secondary | ICD-10-CM | POA: Diagnosis not present

## 2022-02-12 DIAGNOSIS — M79605 Pain in left leg: Secondary | ICD-10-CM | POA: Diagnosis not present

## 2022-02-12 DIAGNOSIS — M5416 Radiculopathy, lumbar region: Secondary | ICD-10-CM | POA: Diagnosis not present

## 2022-02-12 DIAGNOSIS — M9901 Segmental and somatic dysfunction of cervical region: Secondary | ICD-10-CM | POA: Diagnosis not present

## 2022-02-16 DIAGNOSIS — M5416 Radiculopathy, lumbar region: Secondary | ICD-10-CM | POA: Diagnosis not present

## 2022-02-16 DIAGNOSIS — M9903 Segmental and somatic dysfunction of lumbar region: Secondary | ICD-10-CM | POA: Diagnosis not present

## 2022-02-16 DIAGNOSIS — M9901 Segmental and somatic dysfunction of cervical region: Secondary | ICD-10-CM | POA: Diagnosis not present

## 2022-02-16 DIAGNOSIS — M79605 Pain in left leg: Secondary | ICD-10-CM | POA: Diagnosis not present

## 2022-02-17 DIAGNOSIS — M9901 Segmental and somatic dysfunction of cervical region: Secondary | ICD-10-CM | POA: Diagnosis not present

## 2022-02-17 DIAGNOSIS — M79605 Pain in left leg: Secondary | ICD-10-CM | POA: Diagnosis not present

## 2022-02-17 DIAGNOSIS — M5416 Radiculopathy, lumbar region: Secondary | ICD-10-CM | POA: Diagnosis not present

## 2022-02-17 DIAGNOSIS — M9903 Segmental and somatic dysfunction of lumbar region: Secondary | ICD-10-CM | POA: Diagnosis not present

## 2022-02-19 DIAGNOSIS — M79605 Pain in left leg: Secondary | ICD-10-CM | POA: Diagnosis not present

## 2022-02-19 DIAGNOSIS — M9901 Segmental and somatic dysfunction of cervical region: Secondary | ICD-10-CM | POA: Diagnosis not present

## 2022-02-19 DIAGNOSIS — M9903 Segmental and somatic dysfunction of lumbar region: Secondary | ICD-10-CM | POA: Diagnosis not present

## 2022-02-19 DIAGNOSIS — M5416 Radiculopathy, lumbar region: Secondary | ICD-10-CM | POA: Diagnosis not present

## 2022-02-23 DIAGNOSIS — M79605 Pain in left leg: Secondary | ICD-10-CM | POA: Diagnosis not present

## 2022-02-23 DIAGNOSIS — M5416 Radiculopathy, lumbar region: Secondary | ICD-10-CM | POA: Diagnosis not present

## 2022-02-23 DIAGNOSIS — M9901 Segmental and somatic dysfunction of cervical region: Secondary | ICD-10-CM | POA: Diagnosis not present

## 2022-02-23 DIAGNOSIS — M9903 Segmental and somatic dysfunction of lumbar region: Secondary | ICD-10-CM | POA: Diagnosis not present

## 2022-02-25 DIAGNOSIS — M79605 Pain in left leg: Secondary | ICD-10-CM | POA: Diagnosis not present

## 2022-02-25 DIAGNOSIS — M5416 Radiculopathy, lumbar region: Secondary | ICD-10-CM | POA: Diagnosis not present

## 2022-02-25 DIAGNOSIS — M9903 Segmental and somatic dysfunction of lumbar region: Secondary | ICD-10-CM | POA: Diagnosis not present

## 2022-02-25 DIAGNOSIS — M9901 Segmental and somatic dysfunction of cervical region: Secondary | ICD-10-CM | POA: Diagnosis not present

## 2022-02-26 DIAGNOSIS — M5416 Radiculopathy, lumbar region: Secondary | ICD-10-CM | POA: Diagnosis not present

## 2022-02-26 DIAGNOSIS — M79605 Pain in left leg: Secondary | ICD-10-CM | POA: Diagnosis not present

## 2022-02-26 DIAGNOSIS — M9903 Segmental and somatic dysfunction of lumbar region: Secondary | ICD-10-CM | POA: Diagnosis not present

## 2022-02-26 DIAGNOSIS — M9901 Segmental and somatic dysfunction of cervical region: Secondary | ICD-10-CM | POA: Diagnosis not present

## 2022-03-09 DIAGNOSIS — M79605 Pain in left leg: Secondary | ICD-10-CM | POA: Diagnosis not present

## 2022-03-09 DIAGNOSIS — M9901 Segmental and somatic dysfunction of cervical region: Secondary | ICD-10-CM | POA: Diagnosis not present

## 2022-03-09 DIAGNOSIS — M9903 Segmental and somatic dysfunction of lumbar region: Secondary | ICD-10-CM | POA: Diagnosis not present

## 2022-03-09 DIAGNOSIS — M5416 Radiculopathy, lumbar region: Secondary | ICD-10-CM | POA: Diagnosis not present

## 2022-03-11 DIAGNOSIS — M9903 Segmental and somatic dysfunction of lumbar region: Secondary | ICD-10-CM | POA: Diagnosis not present

## 2022-03-11 DIAGNOSIS — M9901 Segmental and somatic dysfunction of cervical region: Secondary | ICD-10-CM | POA: Diagnosis not present

## 2022-03-11 DIAGNOSIS — M79605 Pain in left leg: Secondary | ICD-10-CM | POA: Diagnosis not present

## 2022-03-11 DIAGNOSIS — M5416 Radiculopathy, lumbar region: Secondary | ICD-10-CM | POA: Diagnosis not present

## 2022-03-17 DIAGNOSIS — M5416 Radiculopathy, lumbar region: Secondary | ICD-10-CM | POA: Diagnosis not present

## 2022-03-17 DIAGNOSIS — M79605 Pain in left leg: Secondary | ICD-10-CM | POA: Diagnosis not present

## 2022-03-17 DIAGNOSIS — M9901 Segmental and somatic dysfunction of cervical region: Secondary | ICD-10-CM | POA: Diagnosis not present

## 2022-03-17 DIAGNOSIS — M9903 Segmental and somatic dysfunction of lumbar region: Secondary | ICD-10-CM | POA: Diagnosis not present

## 2022-03-19 DIAGNOSIS — M5416 Radiculopathy, lumbar region: Secondary | ICD-10-CM | POA: Diagnosis not present

## 2022-03-19 DIAGNOSIS — M79605 Pain in left leg: Secondary | ICD-10-CM | POA: Diagnosis not present

## 2022-03-19 DIAGNOSIS — M9903 Segmental and somatic dysfunction of lumbar region: Secondary | ICD-10-CM | POA: Diagnosis not present

## 2022-03-19 DIAGNOSIS — M9901 Segmental and somatic dysfunction of cervical region: Secondary | ICD-10-CM | POA: Diagnosis not present

## 2022-03-24 DIAGNOSIS — M5416 Radiculopathy, lumbar region: Secondary | ICD-10-CM | POA: Diagnosis not present

## 2022-03-24 DIAGNOSIS — M9901 Segmental and somatic dysfunction of cervical region: Secondary | ICD-10-CM | POA: Diagnosis not present

## 2022-03-24 DIAGNOSIS — M79605 Pain in left leg: Secondary | ICD-10-CM | POA: Diagnosis not present

## 2022-03-24 DIAGNOSIS — M9903 Segmental and somatic dysfunction of lumbar region: Secondary | ICD-10-CM | POA: Diagnosis not present

## 2022-03-26 DIAGNOSIS — M9903 Segmental and somatic dysfunction of lumbar region: Secondary | ICD-10-CM | POA: Diagnosis not present

## 2022-03-26 DIAGNOSIS — M5416 Radiculopathy, lumbar region: Secondary | ICD-10-CM | POA: Diagnosis not present

## 2022-03-26 DIAGNOSIS — M9901 Segmental and somatic dysfunction of cervical region: Secondary | ICD-10-CM | POA: Diagnosis not present

## 2022-03-26 DIAGNOSIS — M79605 Pain in left leg: Secondary | ICD-10-CM | POA: Diagnosis not present

## 2022-03-30 DIAGNOSIS — M9901 Segmental and somatic dysfunction of cervical region: Secondary | ICD-10-CM | POA: Diagnosis not present

## 2022-03-30 DIAGNOSIS — M9903 Segmental and somatic dysfunction of lumbar region: Secondary | ICD-10-CM | POA: Diagnosis not present

## 2022-03-30 DIAGNOSIS — M79605 Pain in left leg: Secondary | ICD-10-CM | POA: Diagnosis not present

## 2022-03-30 DIAGNOSIS — M5416 Radiculopathy, lumbar region: Secondary | ICD-10-CM | POA: Diagnosis not present

## 2022-04-02 DIAGNOSIS — M5416 Radiculopathy, lumbar region: Secondary | ICD-10-CM | POA: Diagnosis not present

## 2022-04-02 DIAGNOSIS — M9903 Segmental and somatic dysfunction of lumbar region: Secondary | ICD-10-CM | POA: Diagnosis not present

## 2022-04-02 DIAGNOSIS — M79605 Pain in left leg: Secondary | ICD-10-CM | POA: Diagnosis not present

## 2022-04-02 DIAGNOSIS — M9901 Segmental and somatic dysfunction of cervical region: Secondary | ICD-10-CM | POA: Diagnosis not present

## 2022-04-06 DIAGNOSIS — M9903 Segmental and somatic dysfunction of lumbar region: Secondary | ICD-10-CM | POA: Diagnosis not present

## 2022-04-06 DIAGNOSIS — M9901 Segmental and somatic dysfunction of cervical region: Secondary | ICD-10-CM | POA: Diagnosis not present

## 2022-04-06 DIAGNOSIS — M79605 Pain in left leg: Secondary | ICD-10-CM | POA: Diagnosis not present

## 2022-04-06 DIAGNOSIS — M5416 Radiculopathy, lumbar region: Secondary | ICD-10-CM | POA: Diagnosis not present

## 2022-04-09 DIAGNOSIS — M79605 Pain in left leg: Secondary | ICD-10-CM | POA: Diagnosis not present

## 2022-04-09 DIAGNOSIS — M9901 Segmental and somatic dysfunction of cervical region: Secondary | ICD-10-CM | POA: Diagnosis not present

## 2022-04-09 DIAGNOSIS — M9903 Segmental and somatic dysfunction of lumbar region: Secondary | ICD-10-CM | POA: Diagnosis not present

## 2022-04-09 DIAGNOSIS — M5416 Radiculopathy, lumbar region: Secondary | ICD-10-CM | POA: Diagnosis not present

## 2022-04-13 DIAGNOSIS — M79605 Pain in left leg: Secondary | ICD-10-CM | POA: Diagnosis not present

## 2022-04-13 DIAGNOSIS — M9903 Segmental and somatic dysfunction of lumbar region: Secondary | ICD-10-CM | POA: Diagnosis not present

## 2022-04-13 DIAGNOSIS — M5416 Radiculopathy, lumbar region: Secondary | ICD-10-CM | POA: Diagnosis not present

## 2022-04-13 DIAGNOSIS — M9901 Segmental and somatic dysfunction of cervical region: Secondary | ICD-10-CM | POA: Diagnosis not present

## 2022-04-15 DIAGNOSIS — Z23 Encounter for immunization: Secondary | ICD-10-CM | POA: Diagnosis not present

## 2022-04-16 DIAGNOSIS — M9901 Segmental and somatic dysfunction of cervical region: Secondary | ICD-10-CM | POA: Diagnosis not present

## 2022-04-16 DIAGNOSIS — M5416 Radiculopathy, lumbar region: Secondary | ICD-10-CM | POA: Diagnosis not present

## 2022-04-16 DIAGNOSIS — M79605 Pain in left leg: Secondary | ICD-10-CM | POA: Diagnosis not present

## 2022-04-16 DIAGNOSIS — M9903 Segmental and somatic dysfunction of lumbar region: Secondary | ICD-10-CM | POA: Diagnosis not present

## 2022-04-20 DIAGNOSIS — M9903 Segmental and somatic dysfunction of lumbar region: Secondary | ICD-10-CM | POA: Diagnosis not present

## 2022-04-20 DIAGNOSIS — M9901 Segmental and somatic dysfunction of cervical region: Secondary | ICD-10-CM | POA: Diagnosis not present

## 2022-04-20 DIAGNOSIS — M79605 Pain in left leg: Secondary | ICD-10-CM | POA: Diagnosis not present

## 2022-04-20 DIAGNOSIS — M5416 Radiculopathy, lumbar region: Secondary | ICD-10-CM | POA: Diagnosis not present

## 2022-04-23 DIAGNOSIS — M9901 Segmental and somatic dysfunction of cervical region: Secondary | ICD-10-CM | POA: Diagnosis not present

## 2022-04-23 DIAGNOSIS — M9903 Segmental and somatic dysfunction of lumbar region: Secondary | ICD-10-CM | POA: Diagnosis not present

## 2022-04-23 DIAGNOSIS — M79605 Pain in left leg: Secondary | ICD-10-CM | POA: Diagnosis not present

## 2022-04-23 DIAGNOSIS — M5416 Radiculopathy, lumbar region: Secondary | ICD-10-CM | POA: Diagnosis not present

## 2022-04-27 DIAGNOSIS — M9901 Segmental and somatic dysfunction of cervical region: Secondary | ICD-10-CM | POA: Diagnosis not present

## 2022-04-27 DIAGNOSIS — M9903 Segmental and somatic dysfunction of lumbar region: Secondary | ICD-10-CM | POA: Diagnosis not present

## 2022-04-27 DIAGNOSIS — M5416 Radiculopathy, lumbar region: Secondary | ICD-10-CM | POA: Diagnosis not present

## 2022-04-27 DIAGNOSIS — M79605 Pain in left leg: Secondary | ICD-10-CM | POA: Diagnosis not present

## 2022-04-30 DIAGNOSIS — M9903 Segmental and somatic dysfunction of lumbar region: Secondary | ICD-10-CM | POA: Diagnosis not present

## 2022-04-30 DIAGNOSIS — M9901 Segmental and somatic dysfunction of cervical region: Secondary | ICD-10-CM | POA: Diagnosis not present

## 2022-04-30 DIAGNOSIS — M5416 Radiculopathy, lumbar region: Secondary | ICD-10-CM | POA: Diagnosis not present

## 2022-04-30 DIAGNOSIS — M79605 Pain in left leg: Secondary | ICD-10-CM | POA: Diagnosis not present

## 2022-05-04 DIAGNOSIS — M4317 Spondylolisthesis, lumbosacral region: Secondary | ICD-10-CM | POA: Diagnosis not present

## 2022-05-04 DIAGNOSIS — Z6829 Body mass index (BMI) 29.0-29.9, adult: Secondary | ICD-10-CM | POA: Diagnosis not present

## 2022-05-04 DIAGNOSIS — M5416 Radiculopathy, lumbar region: Secondary | ICD-10-CM | POA: Diagnosis not present

## 2022-05-06 ENCOUNTER — Ambulatory Visit (INDEPENDENT_AMBULATORY_CARE_PROVIDER_SITE_OTHER): Payer: Medicare Other

## 2022-05-06 ENCOUNTER — Ambulatory Visit: Payer: Medicare Other

## 2022-05-06 VITALS — Ht 70.0 in | Wt 207.0 lb

## 2022-05-06 DIAGNOSIS — Z0000 Encounter for general adult medical examination without abnormal findings: Secondary | ICD-10-CM

## 2022-05-06 NOTE — Progress Notes (Signed)
Subjective:   Eric [name] is a 71 y.o. male who presents for Medicare Annual/Subsequent preventive examination.  Review of Systems    Virtual Visit via Telephone Note  I connected with  Eric [name] on 05/06/22 at  3:15 PM EDT by telephone and verified that I am speaking with the correct person using two identifiers.  Location: Patient: Home Provider: Office Persons participating in the virtual visit: patient/Nurse Health Advisor   I discussed the limitations, risks, security and privacy concerns of performing an evaluation and management service by telephone and the availability of in person appointments. The patient expressed understanding and agreed to proceed.  Interactive audio and video telecommunications were attempted between this nurse and patient, however failed, due to patient having technical difficulties OR patient did not have access to video capability.  We continued and completed visit with audio only.  Some vital signs may be absent or patient reported.   Criselda Peaches, LPN  Cardiac Risk Factors include: advanced age (>88mn, >>71women);male gender     Objective:    Today's Vitals   05/06/22 1523  Weight: 207 lb (93.9 kg)  Height: '5\' 10"'$  (1.778 m)   Body mass index is 29.7 kg/m.     05/06/2022    3:31 PM 04/23/2021    3:26 PM  Advanced Directives  Does Patient Have a Medical Advance Directive? Yes Yes  Type of [place] will [place] Does patient want to make changes to medical advance directive? No - Patient declined   Copy of [place] Chart? Yes - validated most recent copy scanned in chart (See row information) No - copy requested    Current Medications (verified) Outpatient Encounter Medications as of 05/06/2022  Medication Sig   Cetirizine HCl 10 MG CAPS    fluticasone (FLONASE) 50 MCG/ACT nasal spray Place 2 sprays into both nostrils 2 (two)  times daily.   HYDROcodone-acetaminophen (NORCO/VICODIN) 5-325 MG tablet Take 1 tablet by mouth every 6 (six) hours as needed for moderate pain.   LORazepam (ATIVAN) 0.5 MG tablet Take 1 tablet (0.5 mg total) by mouth every 8 (eight) hours as needed for anxiety.   tadalafil (CIALIS) 20 MG tablet TAKE 1 TABLET BY MOUTH EVERY OTHER DAY AS NEEDED FOR ERECTILE DYSFUNCTION   No facility-administered encounter medications on file as of 05/06/2022.    Allergies (verified) Ciprofloxacin, Metronidazole, and Flagyl [metronidazole hcl]   History: Past Medical History:  Diagnosis Date   Anal fissure    Chicken pox    DEPRESSION 12/25/2008   Dyslipidemia    LIBIDO, DECREASED 12/25/2008   SKIN CANCER, HX OF 12/25/2008   Past Surgical History:  Procedure Laterality Date   KNEE ARTHROSCOPY  1994, 1996   right, left   NASAL POLYP SURGERY  2004, 2009, 2012   x 4   SHOULDER ARTHROSCOPY W/ ROTATOR CUFF REPAIR  2005   TONSILLECTOMY     Family History  Problem Relation Age of Onset   Alzheimer's disease Father 641  Cancer Father        melanoma   Multiple sclerosis Brother    Social History   Socioeconomic History   Marital status: Divorced    Spouse name: Not on file   Number of children: 3   Years of education: Not on file   Highest education level: Master's degree (e.g., MA, MS, MEng, MEd, MSW, MBA)  Occupational History   Occupation: LTherapist, music  Employer: [employer]  Tobacco Use   Smoking status: Former    Types: Cigarettes    Quit date: 11/20/2006    Years since quitting: 15.4   Smokeless tobacco: Never  Vaping Use   Vaping Use: Never used  Substance and Sexual Activity   Alcohol use: Yes   Drug use: No   Sexual activity: Not on file  Other Topics Concern   Not on file  Social History Narrative   Not on file   Social Determinants of Health   Financial Resource Strain: Low Risk  (05/06/2022)   Overall Financial Resource Strain (CARDIA)    Difficulty of  Paying Living Expenses: Not hard at all  Food Insecurity: No Food Insecurity (05/06/2022)   Hunger Vital Sign    Worried About Running Out of Food in the Last Year: Never true    Ran Out of Food in the Last Year: Never true  Transportation Needs: No Transportation Needs (05/06/2022)   PRAPARE - Hydrologist (Medical): No    Lack of Transportation (Non-Medical): No  Physical Activity: Sufficiently Active (05/06/2022)   Exercise Vital Sign    Days of Exercise per Week: 6 days    Minutes of Exercise per Session: 150+ min  Stress: No Stress Concern Present (05/06/2022)   [place]    Feeling of Stress : Not at all  Social Connections: Moderately Integrated (05/06/2022)   Social Connection and Isolation Panel [NHANES]    Frequency of Communication with Friends and Family: More than three times a week    Frequency of Social Gatherings with Friends and Family: More than three times a week    Attends Religious Services: More than 4 times per year    Active Member of [employer] or Organizations: Yes    Attends Music therapist: More than 4 times per year    Marital Status: Divorced    Tobacco Counseling Counseling given: Not Answered   Clinical Intake:  Pre-visit preparation completed: No  Pain : No/denies pain     BMI - recorded: 29.7 Nutritional Status: BMI 25 -29 Overweight Nutritional Risks: None Diabetes: No  How often do you need to have someone help you when you read instructions, pamphlets, or other written materials from your doctor or pharmacy?: 1 - Never  Diabetic?  No  Interpreter Needed?: No  Information entered by :: Rolene Arbour LPN   Activities of Daily Living    05/06/2022    3:29 PM  In your present state of health, do you have any difficulty performing the following activities:  Hearing? 1  Comment Wears hearin aids  Vision? 0  Difficulty  concentrating or making decisions? 0  Walking or climbing stairs? 0  Dressing or bathing? 0  Doing errands, shopping? 0  Preparing Food and eating ? N  Using the Toilet? N  In the past six months, have you accidently leaked urine? N  Do you have problems with loss of bowel control? N  Managing your Medications? N  Managing your Finances? N  Housekeeping or managing your Housekeeping? N    Patient Care Team: [name], MD as PCP - General  Indicate any recent Medical Services you may have received from other than [facility] providers in the past year (date may be approximate).     Assessment:   This is a routine wellness examination for [facility].  Hearing/Vision screen Hearing Screening - Comments:: Wears hearing  aids Vision Screening - Comments:: Wears rx glasses - up to date with routine eye exams with  [place] issues and exercise activities discussed: Current Exercise Habits: Home exercise routine, Type of exercise: walking, Time (Minutes): > 60, Frequency (Times/Week): 6, Weekly Exercise (Minutes/Week): 0, Intensity: Moderate, Exercise limited by: None identified   Goals Addressed               This Visit's Progress     No current goals (pt-stated)         Depression Screen    05/06/2022    3:28 PM 04/23/2021    3:24 PM  PHQ 2/9 Scores  PHQ - 2 Score 0 0    Fall Risk    05/06/2022    3:30 PM 05/15/2021   12:12 PM 04/23/2021    3:27 PM  [place] in the past year? 0 1 1  Number falls in past yr: 0 0 1  Injury with Fall? 0 1 1  Comment   5 stitches in chin  Risk for fall due to : No Fall Risks  Impaired vision  Follow up Falls prevention discussed  Falls prevention discussed    FALL RISK PREVENTION PERTAINING TO THE HOME:  Any stairs in or around the home? No If so, are there any without handrails? No  Home free of loose throw rugs in walkways, pet beds, electrical cords, etc? Yes  Adequate lighting in your home to reduce  risk of falls? Yes   ASSISTIVE DEVICES UTILIZED TO PREVENT FALLS:  Life alert? No  Use of a cane, walker or w/c? No  Grab bars in the bathroom? No  Shower chair or bench in shower? No  Elevated toilet seat or a handicapped toilet? No   TIMED UP AND GO:  Was the test performed? No . Audio Visit  Cognitive Function:        05/06/2022    3:31 PM 04/23/2021    3:30 PM  6CIT Screen  What Year? 0 points 0 points  What month? 0 points 0 points  What time? 0 points 0 points  Count back from 20 0 points 0 points  Months in reverse 0 points 0 points  Repeat phrase 0 points 0 points  Total Score 0 points 0 points    Immunizations Immunization History  Administered Date(s) Administered   Hepatitis A 03/15/2009, 09/20/2009   Influenza Split 05/30/2014   Influenza Whole 07/09/2010, 04/15/2012   Influenza, High Dose Seasonal PF 04/26/2019   Influenza-Unspecified 03/20/2016, 04/19/2019, 04/18/2021   PFIZER(Purple Top)SARS-COV-2 Vaccination 08/12/2019, 09/02/2019, 05/30/2020   Pneumococcal Conjugate-13 07/01/2018, 01/23/2019   Pneumococcal Polysaccharide-23 04/09/2020   Td 07/21/2007, 05/21/2008   Zoster Recombinat (Shingrix) 10/26/2021, 12/26/2021    TDAP status: Up to date  Flu Vaccine status: Up to date  Pneumococcal vaccine status: Up to date  Covid-19 vaccine status: Completed vaccines  Qualifies for Shingles Vaccine? Yes   Zostavax completed Yes   Shingrix Completed?: Yes  Screening Tests Health Maintenance  Topic Date Due   COVID-19 Vaccine (4 - Pfizer series) 05/22/2022 (Originally 07/25/2020)   TETANUS/TDAP  08/08/2024   COLONOSCOPY (Pts 45-78yr Insurance coverage will need to be confirmed)  01/22/2025   Pneumonia Vaccine 69 Years old  Completed   INFLUENZA VACCINE  Completed   Hepatitis C Screening  Completed   Zoster Vaccines- Shingrix  Completed   HPV VACCINES  Aged Out    Health Maintenance  There are no preventive care  reminders to display for this  patient.   Colorectal cancer screening: Type of screening: Colonoscopy. Completed 01/23/15. Repeat every 10 years  Lung Cancer Screening: (Low Dose CT Chest recommended if Age 52-80 years, 30 pack-year currently smoking OR have quit w/in 15years.) does not qualify.     Additional Screening:  Hepatitis C Screening: does qualify; Completed 09/11/16  Vision Screening: Recommended annual ophthalmology exams for early detection of glaucoma and other disorders of the eye. Is the patient up to date with their annual eye exam?  Yes  Who is the provider or what is the name of the office in which the patient attends annual eye exams? [facility] If pt is not established with a provider, would they like to be referred to a provider to establish care? No .   Dental Screening: Recommended annual dental exams for proper oral hygiene  Community Resource Referral / Chronic Care Management:  CRR required this visit?  No   CCM required this visit?  No      Plan:     I have personally reviewed and noted the following in the patient's chart:   Medical and social history Use of alcohol, tobacco or illicit drugs  Current medications and supplements including opioid prescriptions. Patient is not currently taking opioid prescriptions. Functional ability and status Nutritional status Physical activity Advanced directives List of other physicians Hospitalizations, surgeries, and ER visits in previous 12 months Vitals Screenings to include cognitive, depression, and falls Referrals and appointments  In addition, I have reviewed and discussed with patient certain preventive protocols, quality metrics, and best practice recommendations. A written personalized care plan for preventive services as well as general preventive health recommendations were provided to patient.     Criselda Peaches, LPN   [id number]   Nurse Notes: None

## 2022-05-06 NOTE — Patient Instructions (Addendum)
Mr. Eric [name] , Thank you for taking time to come for your Medicare Wellness Visit. I appreciate your ongoing commitment to your health goals. Please review the following plan we discussed and let me know if I can assist you in the future.   These are the goals we discussed:  Goals       No current goals (pt-stated)      Patient Stated      None at this time        This is a list of the screening recommended for you and due dates:  Health Maintenance  Topic Date Due   COVID-19 Vaccine (4 - Pfizer series) 05/22/2022*   Tetanus Vaccine  08/08/2024   Colon Cancer Screening  01/22/2025   Pneumonia Vaccine  Completed   Flu Shot  Completed   Hepatitis C Screening: USPSTF Recommendation to screen - Ages 18-79 yo.  Completed   Zoster (Shingles) Vaccine  Completed   HPV Vaccine  Aged Out  *Topic was postponed. The date shown is not the original due date.    Advanced directives: in Chart  Conditions/risks identified: None  Next appointment: Follow up in one year for your annual wellness visit.   Preventive Care 60 Years and Older, Male  Preventive care refers to lifestyle choices and visits with your health care provider that can promote health and wellness. What does preventive care include? A yearly physical exam. This is also called an annual well check. Dental exams once or twice a year. Routine eye exams. Ask your health care provider how often you should have your eyes checked. Personal lifestyle choices, including: Daily care of your teeth and gums. Regular physical activity. Eating a healthy diet. Avoiding tobacco and drug use. Limiting alcohol use. Practicing safe sex. Taking low doses of aspirin every day. Taking vitamin and mineral supplements as recommended by your health care provider. What happens during an annual well check? The services and screenings done by your health care provider during your annual well check will depend on your age, overall health,  lifestyle risk factors, and family history of disease. Counseling  Your health care provider may ask you questions about your: Alcohol use. Tobacco use. Drug use. Emotional well-being. Home and relationship well-being. Sexual activity. Eating habits. History of falls. Memory and ability to understand (cognition). Work and work Statistician. Screening  You may have the following tests or measurements: Height, weight, and BMI. Blood pressure. Lipid and cholesterol levels. These may be checked every 5 years, or more frequently if you are over 37 years old. Skin check. Lung cancer screening. You may have this screening every year starting at age 63 if you have a 30-pack-year history of smoking and currently smoke or have quit within the past 15 years. Fecal occult blood test (FOBT) of the stool. You may have this test every year starting at age 57. Flexible sigmoidoscopy or colonoscopy. You may have a sigmoidoscopy every 5 years or a colonoscopy every 10 years starting at age 69. Prostate cancer screening. Recommendations will vary depending on your family history and other risks. Hepatitis C blood test. Hepatitis B blood test. Sexually transmitted disease (STD) testing. Diabetes screening. This is done by checking your blood sugar (glucose) after you have not eaten for a while (fasting). You may have this done every 1-3 years. Abdominal aortic aneurysm (AAA) screening. You may need this if you are a current or former smoker. Osteoporosis. You may be screened starting at age 1 if you are at  high risk. Talk with your health care provider about your test results, treatment options, and if necessary, the need for more tests. Vaccines  Your health care provider may recommend certain vaccines, such as: Influenza vaccine. This is recommended every year. Tetanus, diphtheria, and acellular pertussis (Tdap, Td) vaccine. You may need a Td booster every 10 years. Zoster vaccine. You may need this  after age 69. Pneumococcal 13-valent conjugate (PCV13) vaccine. One dose is recommended after age 52. Pneumococcal polysaccharide (PPSV23) vaccine. One dose is recommended after age 86. Talk to your health care provider about which screenings and vaccines you need and how often you need them. This information is not intended to replace advice given to you by your health care provider. Make sure you discuss any questions you have with your health care provider. Document Released: 08/02/2015 Document Revised: 03/25/2016 Document Reviewed: 05/07/2015 Elsevier Interactive Patient Education  2017 [place] Prevention in the Home Falls can cause injuries. They can happen to people of all ages. There are many things you can do to make your home safe and to help prevent falls. What can I do on the outside of my home? Regularly fix the edges of walkways and driveways and fix any cracks. Remove anything that might make you trip as you walk through a door, such as a raised step or threshold. Trim any bushes or trees on the path to your home. Use bright outdoor lighting. Clear any walking paths of anything that might make someone trip, such as rocks or tools. Regularly check to see if handrails are loose or broken. Make sure that both sides of any steps have handrails. Any raised decks and porches should have guardrails on the edges. Have any leaves, snow, or ice cleared regularly. Use sand or salt on walking paths during winter. Clean up any spills in your garage right away. This includes oil or grease spills. What can I do in the bathroom? Use night lights. Install grab bars by the toilet and in the tub and shower. Do not use towel bars as grab bars. Use non-skid mats or decals in the tub or shower. If you need to sit down in the shower, use a plastic, non-slip stool. Keep the floor dry. Clean up any water that spills on the floor as soon as it happens. Remove soap buildup in the tub or  shower regularly. Attach bath mats securely with double-sided non-slip rug tape. Do not have throw rugs and other things on the floor that can make you trip. What can I do in the bedroom? Use night lights. Make sure that you have a light by your bed that is easy to reach. Do not use any sheets or blankets that are too big for your bed. They should not hang down onto the floor. Have a firm chair that has side arms. You can use this for support while you get dressed. Do not have throw rugs and other things on the floor that can make you trip. What can I do in the kitchen? Clean up any spills right away. Avoid walking on wet floors. Keep items that you use a lot in easy-to-reach places. If you need to reach something above you, use a strong step stool that has a grab bar. Keep electrical cords out of the way. Do not use floor polish or wax that makes floors slippery. If you must use wax, use non-skid floor wax. Do not have throw rugs and other things on the floor that  can make you trip. What can I do with my stairs? Do not leave any items on the stairs. Make sure that there are handrails on both sides of the stairs and use them. Fix handrails that are broken or loose. Make sure that handrails are as long as the stairways. Check any carpeting to make sure that it is firmly attached to the stairs. Fix any carpet that is loose or worn. Avoid having throw rugs at the top or bottom of the stairs. If you do have throw rugs, attach them to the floor with carpet tape. Make sure that you have a light switch at the top of the stairs and the bottom of the stairs. If you do not have them, ask someone to add them for you. What else can I do to help prevent falls? Wear shoes that: Do not have high heels. Have rubber bottoms. Are comfortable and fit you well. Are closed at the toe. Do not wear sandals. If you use a stepladder: Make sure that it is fully opened. Do not climb a closed stepladder. Make  sure that both sides of the stepladder are locked into place. Ask someone to hold it for you, if possible. Clearly mark and make sure that you can see: Any grab bars or handrails. First and last steps. Where the edge of each step is. Use tools that help you move around (mobility aids) if they are needed. These include: Canes. Walkers. Scooters. Crutches. Turn on the lights when you go into a dark area. Replace any light bulbs as soon as they burn out. Set up your furniture so you have a clear path. Avoid moving your furniture around. If any of your floors are uneven, fix them. If there are any pets around you, be aware of where they are. Review your medicines with your doctor. Some medicines can make you feel dizzy. This can increase your chance of falling. Ask your doctor what other things that you can do to help prevent falls. This information is not intended to replace advice given to you by your health care provider. Make sure you discuss any questions you have with your health care provider. Document Released: 05/02/2009 Document Revised: 12/12/2015 Document Reviewed: 08/10/2014 Elsevier Interactive Patient Education  2017 [employer].

## 2022-05-12 DIAGNOSIS — M4317 Spondylolisthesis, lumbosacral region: Secondary | ICD-10-CM | POA: Diagnosis not present

## 2022-05-12 DIAGNOSIS — M4316 Spondylolisthesis, lumbar region: Secondary | ICD-10-CM | POA: Diagnosis not present

## 2022-05-12 DIAGNOSIS — M545 Low back pain, unspecified: Secondary | ICD-10-CM | POA: Diagnosis not present

## 2022-05-24 DIAGNOSIS — Z9181 History of falling: Secondary | ICD-10-CM | POA: Diagnosis not present

## 2022-05-24 DIAGNOSIS — R059 Cough, unspecified: Secondary | ICD-10-CM | POA: Diagnosis not present

## 2022-05-24 DIAGNOSIS — J069 Acute upper respiratory infection, unspecified: Secondary | ICD-10-CM | POA: Diagnosis not present

## 2022-05-24 DIAGNOSIS — E669 Obesity, unspecified: Secondary | ICD-10-CM | POA: Diagnosis not present

## 2022-05-27 DIAGNOSIS — M4317 Spondylolisthesis, lumbosacral region: Secondary | ICD-10-CM | POA: Diagnosis not present

## 2022-05-27 DIAGNOSIS — Z6830 Body mass index (BMI) 30.0-30.9, adult: Secondary | ICD-10-CM | POA: Diagnosis not present

## 2022-06-08 ENCOUNTER — Ambulatory Visit (INDEPENDENT_AMBULATORY_CARE_PROVIDER_SITE_OTHER): Payer: Medicare Other | Admitting: Family Medicine

## 2022-06-08 ENCOUNTER — Encounter: Payer: Self-pay | Admitting: Family Medicine

## 2022-06-08 VITALS — BP 126/64 | HR 117 | Temp 97.5°F | Ht 70.0 in | Wt 208.2 lb

## 2022-06-08 DIAGNOSIS — R9431 Abnormal electrocardiogram [ECG] [EKG]: Secondary | ICD-10-CM | POA: Diagnosis not present

## 2022-06-08 DIAGNOSIS — Z01818 Encounter for other preprocedural examination: Secondary | ICD-10-CM | POA: Diagnosis not present

## 2022-06-08 DIAGNOSIS — R739 Hyperglycemia, unspecified: Secondary | ICD-10-CM | POA: Diagnosis not present

## 2022-06-08 NOTE — Progress Notes (Signed)
Established Patient Office Visit  Subjective   Patient ID: Eric [name], male    DOB: 1951-01-04  Age: 71 y.o. MRN: [id number]  Chief Complaint  Patient presents with   Pre-op Exam    HPI   Eric [name] been having some chronic back difficulties.  He has seen spine specialist and proposal for spinal fusion surgery.  He tries to stay active with things like pickleball but this is becoming increasingly difficult.  He is anxious to get surgery done to hopefully help relieve some his symptoms.  Has had great difficulty managing conservatively.  His past problems include history of recurrent depression, history of skin cancer, history of adenomatous colon polyps.  Currently takes no regular medications.  No cardiac history.  No pulmonary problems.  Vigorous exercise has been limited somewhat because of his back difficulties but he denies any chest pain, dizziness, or syncope with activity such as pickleball.  Former smoker.  Quit 2008.  No known family history of premature CAD.  He has had some prediabetes range blood sugars in the past but states he has lost substantial amount of weight due to his efforts.  Past Medical History:  Diagnosis Date   Anal fissure    Chicken pox    DEPRESSION 12/25/2008   Dyslipidemia    LIBIDO, DECREASED 12/25/2008   SKIN CANCER, HX OF 12/25/2008   Past Surgical History:  Procedure Laterality Date   KNEE ARTHROSCOPY  1994, 1996   right, left   NASAL POLYP SURGERY  2004, 2009, 2012   x 4   SHOULDER ARTHROSCOPY W/ ROTATOR CUFF REPAIR  2005   TONSILLECTOMY      reports that he quit smoking about 15 years ago. His smoking use included cigarettes. He has never used smokeless tobacco. He reports current alcohol use. He reports that he does not use drugs. family history includes Alzheimer's disease (age of onset: 84) in his father; Cancer in his father; Multiple sclerosis in his brother. Allergies  Allergen Reactions   Ciprofloxacin Diarrhea    REACTION: GI  upset, C-diff   Metronidazole Hives   Flagyl [Metronidazole Hcl] Rash    Review of Systems  Constitutional:  Negative for chills and fever.  Respiratory:  Negative for shortness of breath.   Cardiovascular:  Negative for chest pain.  Gastrointestinal:  Negative for abdominal pain.  Neurological:  Negative for dizziness.      Objective:     BP 126/64 (BP Location: Left Arm, Patient Position: Sitting, Cuff Size: Normal)   Pulse (!) 117   Temp (!) 97.5 F (36.4 C) (Oral)   Ht '5\' 10"'$  (1.778 m)   Wt 208 lb 3.2 oz (94.4 kg)   SpO2 98%   BMI 29.87 kg/m    Physical Exam Vitals reviewed.  Cardiovascular:     Comments: Regular rhythm but slightly tachycardic with rate around 108 Pulmonary:     Effort: Pulmonary effort is normal.     Breath sounds: Normal breath sounds. No wheezing or rales.  Abdominal:     Palpations: Abdomen is soft.     Comments: Large ventral hernia which is soft and nontender.  No other masses palpated.  Musculoskeletal:     Cervical back: Neck supple.     Right lower leg: No edema.     Left lower leg: No edema.  Lymphadenopathy:     Cervical: No cervical adenopathy.  Neurological:     Mental Status: He is alert.      No results found for  any visits on 06/08/22.    The 10-year ASCVD risk score (Arnett DK, et al., 2019) is: 15.2%    Assessment & Plan:   Patient is here for preoperative assessment for proposed spinal fusion surgery.  He has no cardiac history.  No prior EKG for comparison.  Generally stays very active and denies any recent chest pain symptoms.  EKG shows right bundle branch block with left axis bifascicular block.  Does have history of prediabetes range blood sugars with no recent labs to assess  -Check CBC, basic metabolic panel, L0B -Recommend cardiology referral to further assess risk for surgery in view of abnormal EKG as above   No follow-ups on file.    Carolann Littler, MD

## 2022-06-09 LAB — BASIC METABOLIC PANEL
BUN: 17 mg/dL (ref 6–23)
CO2: 26 mEq/L (ref 19–32)
Calcium: 9.7 mg/dL (ref 8.4–10.5)
Chloride: 100 mEq/L (ref 96–112)
Creatinine, Ser: 0.93 mg/dL (ref 0.40–1.50)
GFR: 83.87 mL/min (ref >60.00)
Glucose, Bld: 79 mg/dL (ref 70–99)
Potassium: 3.7 mEq/L (ref 3.5–5.1)
Sodium: 136 mEq/L (ref 135–145)

## 2022-06-09 LAB — CBC WITH DIFFERENTIAL/PLATELET
Basophils Absolute: 0.1 10*3/uL (ref 0.0–0.1)
Basophils Relative: 0.7 % (ref 0.0–3.0)
Eosinophils Absolute: 0.2 10*3/uL (ref 0.0–0.7)
Eosinophils Relative: 2 % (ref 0.0–5.0)
HCT: 45.5 % (ref 39.0–52.0)
Hemoglobin: 15.4 g/dL (ref 13.0–17.0)
Lymphocytes Relative: 13.7 % (ref 12.0–46.0)
Lymphs Abs: 1.2 10*3/uL (ref 0.7–4.0)
MCHC: 33.9 g/dL (ref 30.0–36.0)
MCV: 94.7 fl (ref 78.0–100.0)
Monocytes Absolute: 1 10*3/uL (ref 0.1–1.0)
Monocytes Relative: 11.3 % (ref 3.0–12.0)
Neutro Abs: 6.5 10*3/uL (ref 1.4–7.7)
Neutrophils Relative %: 72.3 % (ref 43.0–77.0)
Platelets: 237 10*3/uL (ref 150.0–400.0)
RBC: 4.81 Mil/uL (ref 4.22–5.81)
RDW: 12.9 % (ref 11.5–15.5)
WBC: 9 10*3/uL (ref 4.0–10.5)

## 2022-06-09 LAB — HEMOGLOBIN A1C: Hgb A1c MFr Bld: 5.4 % (ref 4.6–6.5)

## 2022-06-15 ENCOUNTER — Encounter: Payer: Self-pay | Admitting: Family Medicine

## 2022-06-15 ENCOUNTER — Other Ambulatory Visit: Payer: Self-pay

## 2022-06-15 DIAGNOSIS — R9431 Abnormal electrocardiogram [ECG] [EKG]: Secondary | ICD-10-CM

## 2022-06-16 NOTE — Progress Notes (Unsigned)
Cardiology Office Note:    Date:  06/17/2022   ID:  Eric [name], DOB [birth year], MRN [id number]  PCP:  [name], MD   [facility] Providers Cardiologist:  None     Referring MD: Dawley, Theodoro Doing, DO   No chief complaint on file. Abnormal Ecg  History of Present Illness:    Eric [name] is a 71 y.o. male seen at the request of Dr Eric [name] for evaluation of abnormal Ecg. He has a history of HLD. He was seen recently for pre op evaluation for spine surgery. Ecg obtained showing RBBB and LAFB. Patient reports that when he had colonoscopy 2 years ago he was told he has a bundle branch block. He denies any other cardiac history. He has been very active speed walking and playing pickleball on a daily basis. Denies any chest pain, dyspnea or palpitations. No syncope. No history of murmur. Now activity limited by sciatica that has failed conservative therapy.   Past Medical History:  Diagnosis Date   Anal fissure    Chicken pox    DEPRESSION 12/25/2008   Dyslipidemia    LIBIDO, DECREASED 12/25/2008   SKIN CANCER, HX OF 12/25/2008    Past Surgical History:  Procedure Laterality Date   KNEE ARTHROSCOPY  1994, 1996   right, left   NASAL POLYP SURGERY  2004, 2009, 2012   x 4   SHOULDER ARTHROSCOPY W/ ROTATOR CUFF REPAIR  2005   TONSILLECTOMY      Current Medications: Current Meds  Medication Sig   Cetirizine HCl 10 MG CAPS    fluticasone (FLONASE) 50 MCG/ACT nasal spray Place 2 sprays into both nostrils 2 (two) times daily.   tadalafil (CIALIS) 20 MG tablet TAKE 1 TABLET BY MOUTH EVERY OTHER DAY AS NEEDED FOR ERECTILE DYSFUNCTION     Allergies:   Ciprofloxacin, Metronidazole, and Flagyl [metronidazole hcl]   Social History   Socioeconomic History   Marital status: Divorced    Spouse name: Not on file   Number of children: 3   Years of education: Not on file   Highest education level: Master's degree (e.g., MA, MS, MEng, MEd, MSW, MBA)  Occupational  History   Occupation: Production designer, theatre/television/film: [employer]  Tobacco Use   Smoking status: Former    Types: Cigarettes    Quit date: 11/20/2006    Years since quitting: 15.5   Smokeless tobacco: Never  Vaping Use   Vaping Use: Never used  Substance and Sexual Activity   Alcohol use: Yes   Drug use: No   Sexual activity: Not on file  Other Topics Concern   Not on file  Social History Narrative   Not on file   Social Determinants of Health   Financial Resource Strain: Low Risk  (06/04/2022)   Overall Financial Resource Strain (CARDIA)    Difficulty of Paying Living Expenses: Not very hard  Food Insecurity: No Food Insecurity (06/04/2022)   Hunger Vital Sign    Worried About Running Out of Food in the Last Year: Never true    [place] in the Last Year: Never true  Transportation Needs: No Transportation Needs (06/04/2022)   PRAPARE - Hydrologist (Medical): No    Lack of Transportation (Non-Medical): No  Physical Activity: Sufficiently Active (06/04/2022)   Exercise Vital Sign    Days of Exercise per Week: 6 days    Minutes of Exercise per Session: 120 min  Stress: No Stress Concern Present (06/04/2022)   [place]    Feeling of Stress : Only a little  Social Connections: Moderately Integrated (06/04/2022)   Social Connection and Isolation Panel [NHANES]    Frequency of Communication with Friends and Family: Twice a week    Frequency of Social Gatherings with Friends and Family: More than three times a week    Attends Religious Services: 1 to 4 times per year    Active Member of [employer] or Organizations: Yes    Attends Music therapist: More than 4 times per year    Marital Status: Divorced     Family History: The patient's family history includes Alzheimer's disease (age of onset: 63) in his father; Cancer in his father; Multiple sclerosis in his  brother; Parkinson's disease in his brother.  ROS:   Please see the history of present illness.     All other systems reviewed and are negative.  EKGs/Labs/Other Studies Reviewed:    The following studies were reviewed today: none  EKG:  EKG is not ordered today.  The ekg ordered 06/08/22 demonstrates sinus tachy with rate 117. LAFB and RBBB. I have personally reviewed and interpreted this study.   Recent Labs: 06/08/2022: BUN 17; Creatinine, Ser 0.93; Hemoglobin 15.4; Platelets 237.0; Potassium 3.7; Sodium 136  Recent Lipid Panel    Component Value Date/Time   CHOL 240 (H) 04/09/2020 0941   TRIG 117 04/09/2020 0941   HDL 73 04/09/2020 0941   CHOLHDL 3.3 04/09/2020 0941   VLDL 22.6 09/11/2016 0919   LDLCALC 144 (H) 04/09/2020 0941   LDLDIRECT 148.2 03/05/2009 0856     Risk Assessment/Calculations:                Physical Exam:    VS:  BP 132/88 (BP Location: Left Arm, Patient Position: Sitting, Cuff Size: Normal)   Pulse (!) 110   Ht '5\' 10"'$  (1.778 m)   Wt 214 lb (97.1 kg)   SpO2 98%   BMI 30.71 kg/m     Wt Readings from Last 3 Encounters:  06/17/22 214 lb (97.1 kg)  06/08/22 208 lb 3.2 oz (94.4 kg)  05/06/22 207 lb (93.9 kg)     GEN:  Well nourished, well developed in no acute distress HEENT: Normal NECK: No JVD; No carotid bruits LYMPHATICS: No lymphadenopathy CARDIAC: RRR, no murmurs, rubs, gallops RESPIRATORY:  Clear to auscultation without rales, wheezing or rhonchi  ABDOMEN: Soft, non-tender, non-distended MUSCULOSKELETAL:  No edema; No deformity  SKIN: Warm and dry NEUROLOGIC:  Alert and oriented x 3 PSYCHIATRIC:  Normal affect   ASSESSMENT:    1. Bifascicular block   2. Sciatica of right side    PLAN:    In order of problems listed above:  Patient has a bifascicular block with RBBBB and LAFB. No evidence of significant AV block. No cardiac symptoms and normal cardiac exam. Apparently BBB present for at least 2 years. Patient has a very  low operative risk for planned spinal surgery. No further CV testing needed. He may proceed. Will follow up PRN.           Medication Adjustments/Labs and Tests Ordered: Current medicines are reviewed at length with the patient today.  Concerns regarding medicines are outlined above.  No orders of the defined types were placed in this encounter.  No orders of the defined types were placed in this encounter.   There are no Patient Instructions on  file for this visit.   Signed, Darrelyn Morro Martinique, MD  06/17/2022 2:13 PM    [place]

## 2022-06-17 ENCOUNTER — Encounter: Payer: Self-pay | Admitting: Cardiology

## 2022-06-17 ENCOUNTER — Ambulatory Visit: Payer: Medicare Other | Attending: Cardiology | Admitting: Cardiology

## 2022-06-17 ENCOUNTER — Telehealth: Payer: Self-pay

## 2022-06-17 ENCOUNTER — Encounter: Payer: Self-pay | Admitting: Family Medicine

## 2022-06-17 VITALS — BP 132/88 | HR 110 | Ht 70.0 in | Wt 214.0 lb

## 2022-06-17 DIAGNOSIS — I452 Bifascicular block: Secondary | ICD-10-CM | POA: Insufficient documentation

## 2022-06-17 DIAGNOSIS — M5431 Sciatica, right side: Secondary | ICD-10-CM | POA: Diagnosis not present

## 2022-06-17 NOTE — Telephone Encounter (Signed)
   Pre-operative Risk Assessment    Patient Name: Eric [name]  DOB: 1951-03-25 MRN: [id number]     Request for Surgical Clearance    Procedure:  Lumbar Fusion   Date of Surgery:  Clearance TBD                                 Surgeon:  Dr. Pieter Partridge Dawley  Surgeon's Group or Practice Name:  [facility]  Phone number:  [phone]. [phone] Fax number:  [phone]   Type of Clearance Requested:   - Medical    Type of Anesthesia:  General    Additional requests/questions:   Oneal Grout   06/17/2022, 2:33 PM

## 2022-06-17 NOTE — Telephone Encounter (Signed)
     Primary Cardiologist: [name], MD  Chart reviewed as part of pre-operative protocol coverage. Given past medical history and time since last visit, based on ACC/AHA guidelines, Eric [name] would be at acceptable risk for the planned procedure without further cardiovascular testing.   Per Dr. Martinique  "Patient has a bifascicular block with RBBBB and LAFB. No evidence of significant AV block. No cardiac symptoms and normal cardiac exam. Apparently BBB present for at least 2 years. Patient has a very low operative risk for planned spinal surgery. No further CV testing needed. He may proceed. Will follow up PRN. "  I will route this recommendation to the requesting party via Epic fax function and remove from pre-op pool.  Please call with questions.  Jossie Ng. Derrico Zhong NP-C     06/17/2022, 4:10 PM [facility] [place] [address] Office [phone] Fax [phone]

## 2022-06-19 ENCOUNTER — Other Ambulatory Visit: Payer: Self-pay | Admitting: Neurological Surgery

## 2022-06-22 IMAGING — MR MR LUMBAR SPINE W/O CM
4 of 5 series · 26 of 48 positions shown · non-contrast
Comparison: Lumbar radiographs 01/30/2019.

CLINICAL DATA: 70-year-old male with left side low back pain
radiating to the leg and ankle for 6 months.

EXAM:
MRI LUMBAR SPINE WITHOUT CONTRAST
TECHNIQUE: Multiplanar, multisequence MR imaging of the lumbar spine was
performed. No intravenous contrast was administered.

[Series 3: T2 · sagittal · 4.0mm · 1.09mm/px · 6 of 17 slices shown (1 of 2)]
[im 1/17]
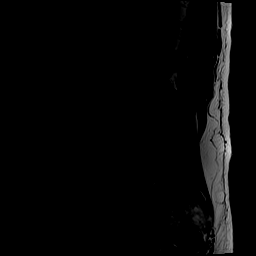
[im 4/17]
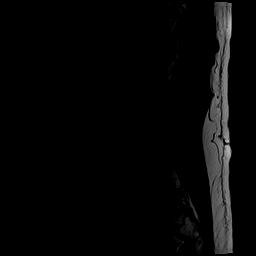
[im 7/17]
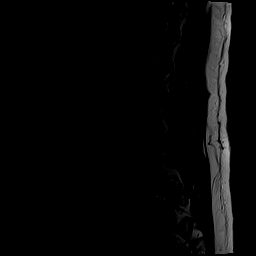
[im 10/17]
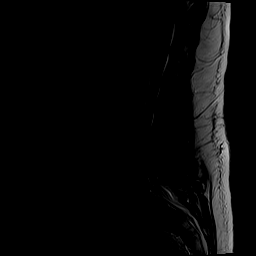
[im 13/17]
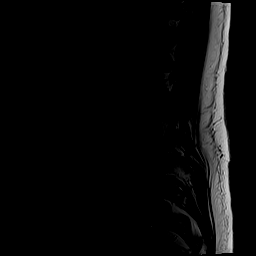
[im 17/17]
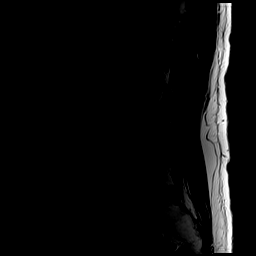

[Series 5: T1 · sagittal · 4.0mm · 1.09mm/px · 6 of 17 slices shown (1 of 2)]
[im 1/17]
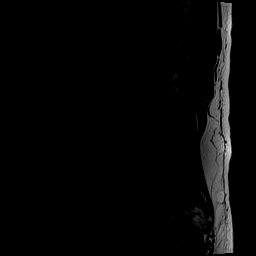
[im 4/17]
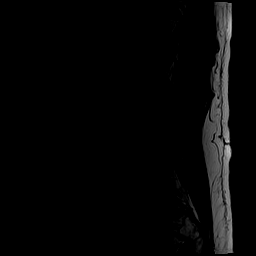
[im 7/17]
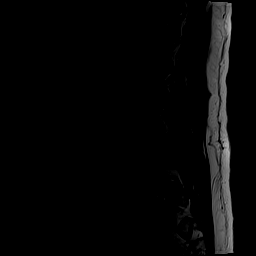
[im 10/17]
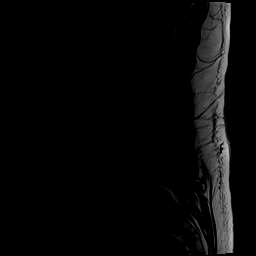
[im 13/17]
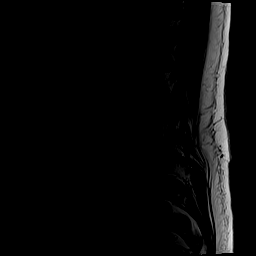
[im 17/17]
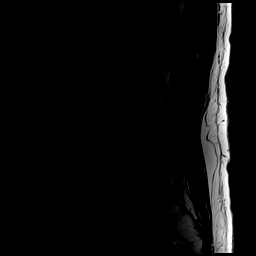

[Series 6: T2 · axial · 4.0mm · 0.39mm/px · z∈[-159,+63]mm · 9 of 41 slices shown (2 of 2)]
[im 1/41]
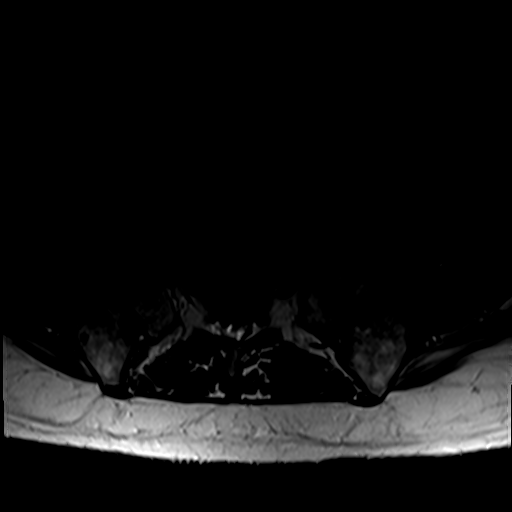
[im 6/41]
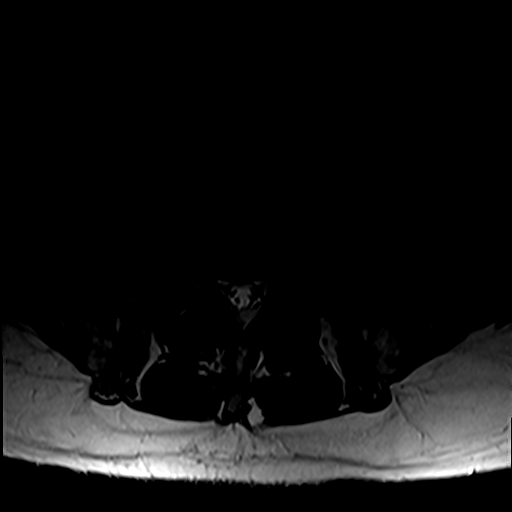
[im 12/41]
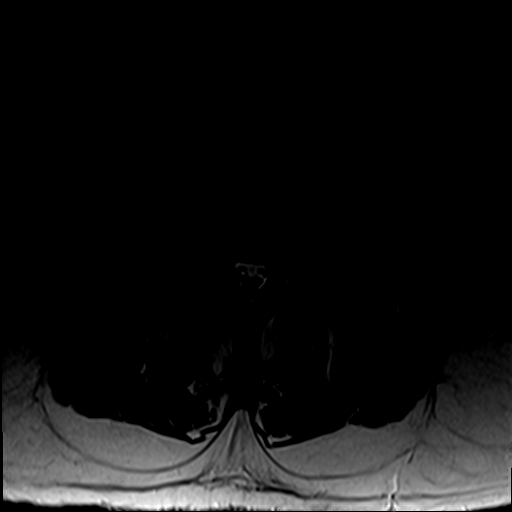
[im 18/41]
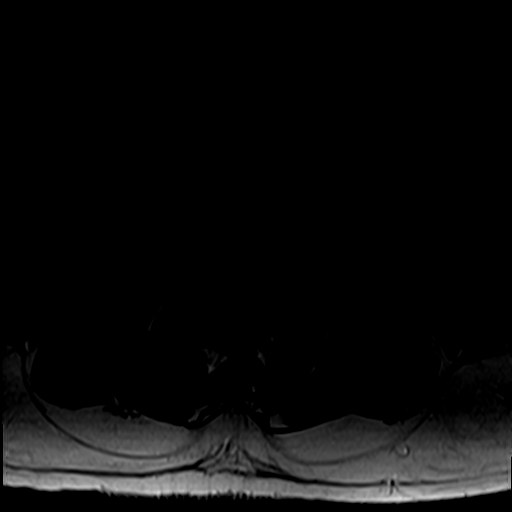
[im 21/41]
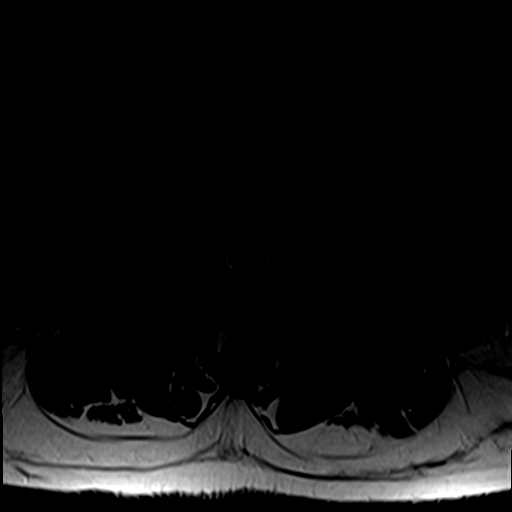
[im 23/41]
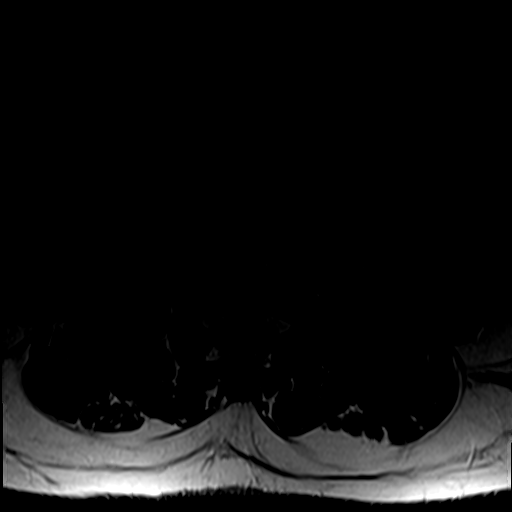
[im 29/41]
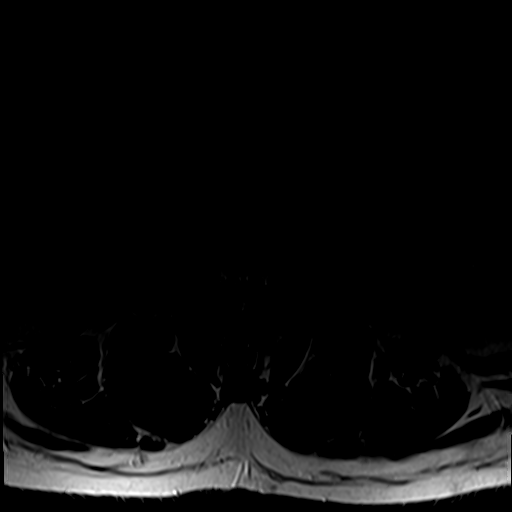
[im 35/41]
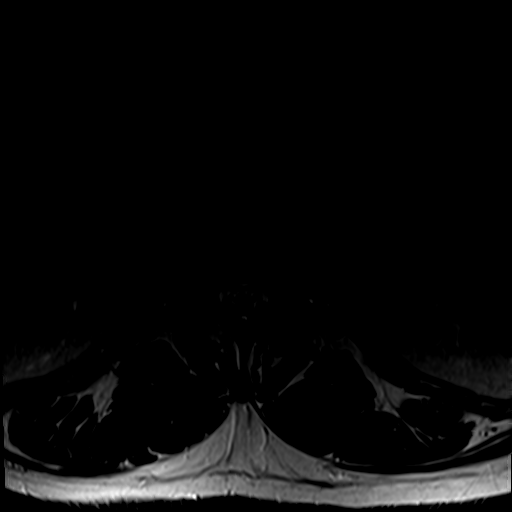
[im 41/41]
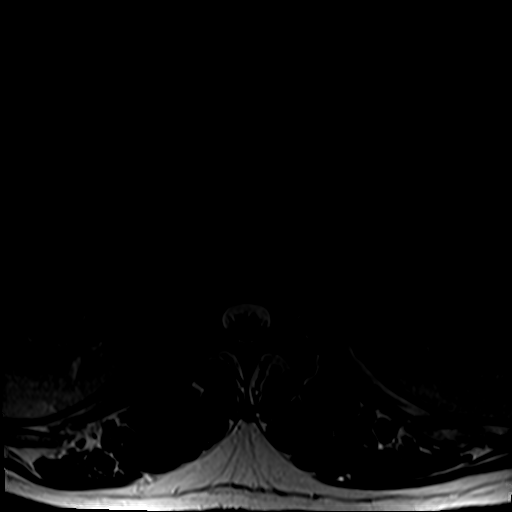

[Series 7: T1 · axial · 4.0mm · 0.39mm/px · z∈[-159,+34]mm · 5 of 41 slices shown (2 of 2)]
[im 1/41]
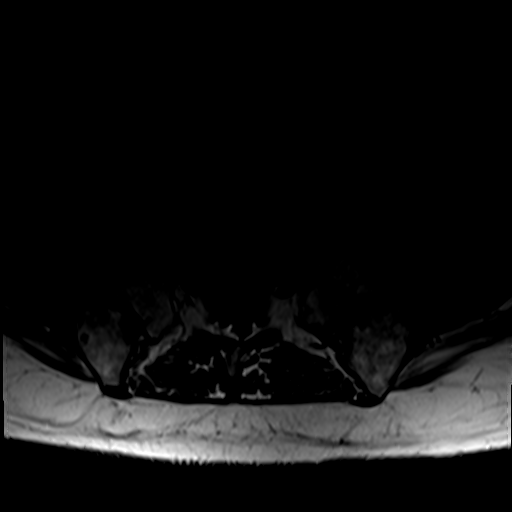
[im 6/41]
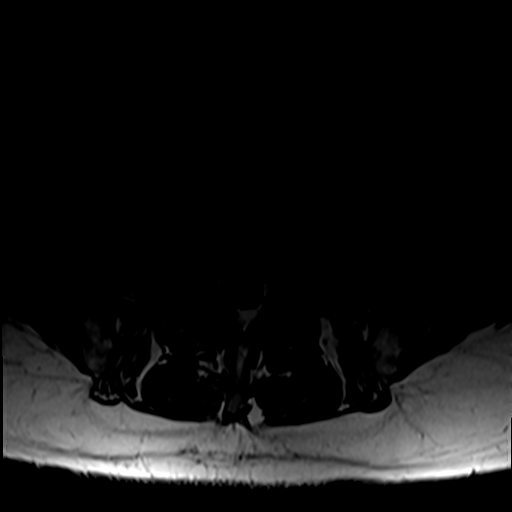
[im 12/41]
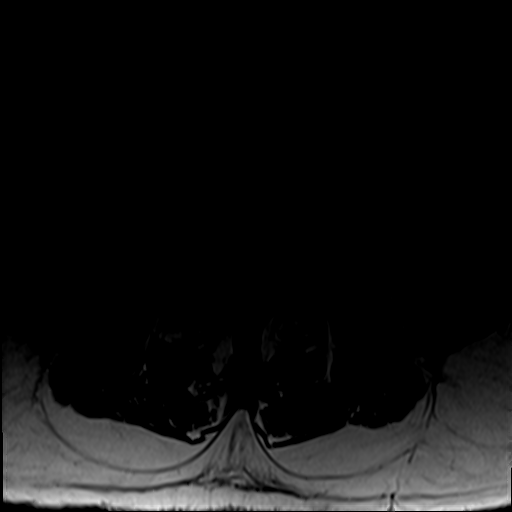
[im 21/41]
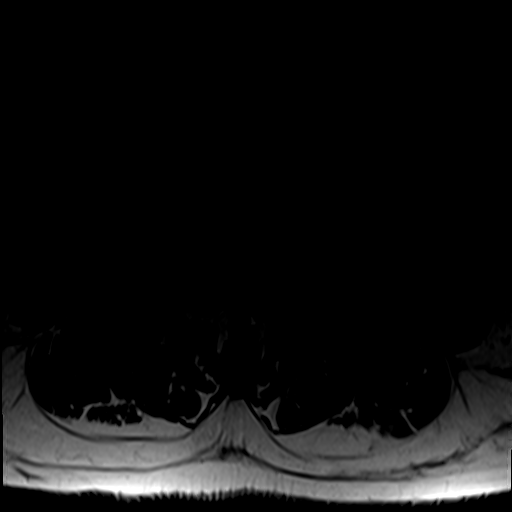
[im 35/41]
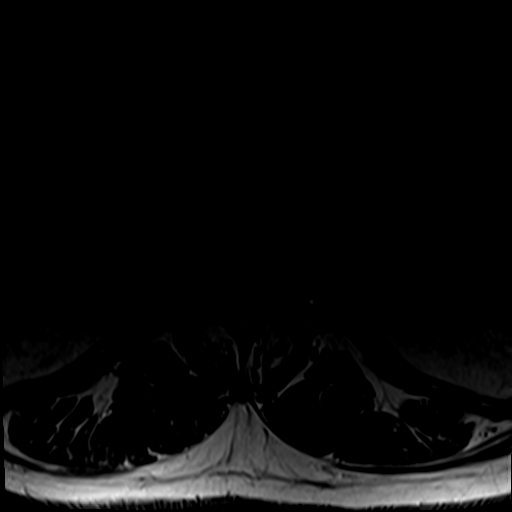

[26 of 48 positions shown; findings below may reference images not displayed]

FINDINGS: Segmentation:  Normal on the comparison.

Alignment: Stable lumbar lordosis since 3535 including grade 1
anterolisthesis of L5 on S1 measuring about 7 mm.

Vertebrae: Chronic L5 pars fractures, more apparent on the right.
Chronic lower lumbar endplate and posterior element degenerative
marrow changes. No marrow edema or evidence of acute osseous
abnormality. Normal background bone marrow signal. Intact visible
sacrum and SI joints. Mild chronic T12 superior endplate deformity.

Conus medullaris and cauda equina: Conus extends to the L1 level. No
lower spinal cord or conus signal abnormality.

Paraspinal and other soft tissues: Negative.

Disc levels:

T11-T12: Mild disc bulge.  No stenosis.

T12-L1:  Negative.

L1-L2:  Mild disc bulge and facet hypertrophy.  No stenosis.

L2-L3: Disc desiccation and disc space loss. Left eccentric
circumferential disc bulging. Mild facet hypertrophy. Mild spinal
and left lateral recess stenosis (left L3 nerve level). And there is
up to moderate left L2 neural foraminal stenosis, in part related to
foraminal disc (series 3, image 11).

L3-L4: Circumferential disc bulge with some endplate spurring.
Moderate facet and ligament flavum hypertrophy greater on the right.
Small posterolateral subchondral cysts on that side which should not
cause neural compromise. Mild epidural lipomatosis and spinal
stenosis. Moderate bilateral L3 neural foraminal stenosis.

L4-L5: Disc desiccation and vacuum disc. Circumferential disc bulge
with some endplate spurring. Moderate facet and mild ligament flavum
hypertrophy. Degenerative facet joint fluid on the left. Spinal
stenosis primarily just below the disc space is moderate and in part
due to epidural lipomatosis and epidural venous prominence. Mild
associated bilateral lateral recess stenosis (descending L5 nerve
levels). Moderate left and moderate to severe right L4 neural
foraminal stenosis.

L5-S1: Anterolisthesis with severe disc space loss. Vacuum disc.
Circumferential disc osteophyte complex eccentric to the right.
Moderate facet hypertrophy. Mild epidural lipomatosis. No spinal or
significant lateral recess stenosis. Severe right greater than left
L5 foraminal stenosis.
IMPRESSION: 1. Chronic L5 pars fractures with grade 1 spondylolisthesis at
L5-S1. Severe disc and advanced posterior element degeneration.
Subsequent severe right > left L5 neural foraminal stenosis.
2. Moderate multifactorial spinal stenosis at L4-L5, with mild
lateral recess stenosis, and moderate to severe right > left L4
foraminal stenosis.
3. Mild spinal stenosis at L2-L3 and L3-L4, with moderate left L2
and bilateral L3 nerve level foraminal stenosis.

## 2022-06-23 ENCOUNTER — Other Ambulatory Visit: Payer: Self-pay | Admitting: Neurological Surgery

## 2022-06-29 DIAGNOSIS — D224 Melanocytic nevi of scalp and neck: Secondary | ICD-10-CM | POA: Diagnosis not present

## 2022-06-29 DIAGNOSIS — D2261 Melanocytic nevi of right upper limb, including shoulder: Secondary | ICD-10-CM | POA: Diagnosis not present

## 2022-06-29 DIAGNOSIS — D220 Melanocytic nevi of lip: Secondary | ICD-10-CM | POA: Diagnosis not present

## 2022-06-29 DIAGNOSIS — L814 Other melanin hyperpigmentation: Secondary | ICD-10-CM | POA: Diagnosis not present

## 2022-06-29 DIAGNOSIS — D2272 Melanocytic nevi of left lower limb, including hip: Secondary | ICD-10-CM | POA: Diagnosis not present

## 2022-06-29 DIAGNOSIS — D2262 Melanocytic nevi of left upper limb, including shoulder: Secondary | ICD-10-CM | POA: Diagnosis not present

## 2022-06-29 DIAGNOSIS — D225 Melanocytic nevi of trunk: Secondary | ICD-10-CM | POA: Diagnosis not present

## 2022-06-29 DIAGNOSIS — L570 Actinic keratosis: Secondary | ICD-10-CM | POA: Diagnosis not present

## 2022-06-29 DIAGNOSIS — D1801 Hemangioma of skin and subcutaneous tissue: Secondary | ICD-10-CM | POA: Diagnosis not present

## 2022-06-29 DIAGNOSIS — L821 Other seborrheic keratosis: Secondary | ICD-10-CM | POA: Diagnosis not present

## 2022-07-02 NOTE — Pre-Procedure Instructions (Signed)
Surgical Instructions    Your procedure is scheduled on Thursday, December 21st.  Report to [facility] Main Entrance "A" at 08:15 A.M., then check in with the Admitting office.  Call this number if you have problems the morning of surgery:  [phone]   If you have any questions prior to your surgery date call [phone]: Open Monday-Friday 8am-4pm    Remember:  Do not eat or drink after midnight the night before your surgery     Take these medicines the morning of surgery with A SIP OF WATER  cetirizine (ZYRTEC)  fluticasone (FLONASE)     As of today, STOP taking any Aspirin (unless otherwise instructed by your surgeon) Aleve, Naproxen, Ibuprofen, Motrin, Advil, Goody's, BC's, all herbal medications, fish oil, and all vitamins.                     Do NOT Smoke (Tobacco/Vaping) for 24 hours prior to your procedure.  If you use a CPAP at night, you may bring your mask/headgear for your overnight stay.   Contacts, glasses, piercing's, hearing aid's, dentures or partials may not be worn into surgery, please bring cases for these belongings.    For patients admitted to the hospital, discharge time will be determined by your treatment team.   Patients discharged the day of surgery will not be allowed to drive home, and someone needs to stay with them for 24 hours.  SURGICAL WAITING ROOM VISITATION Patients having surgery or a procedure may have no more than 2 support people in the waiting area - these visitors may rotate.   Children under the age of 94 must have an adult with them who is not the patient. If the patient needs to stay at the hospital during part of their recovery, the visitor guidelines for inpatient rooms apply. Pre-op nurse will coordinate an appropriate time for 1 support person to accompany patient in pre-op.  This support person may not rotate.   Please refer to the [facility] website for the visitor guidelines for Inpatients (after your surgery is over and  you are in a regular room).    Special instructions:   Eric [name]- Preparing For Surgery  Before surgery, you can play an important role. Because skin is not sterile, your skin needs to be as free of germs as possible. You can reduce the number of germs on your skin by washing with CHG (chlorahexidine gluconate) Soap before surgery.  CHG is an antiseptic cleaner which kills germs and bonds with the skin to continue killing germs even after washing.    Oral Hygiene is also important to reduce your risk of infection.  Remember - BRUSH YOUR TEETH THE MORNING OF SURGERY WITH YOUR REGULAR TOOTHPASTE  Please do not use if you have an allergy to CHG or antibacterial soaps. If your skin becomes reddened/irritated stop using the CHG.  Do not shave (including legs and underarms) for at least 48 hours prior to first CHG shower. It is OK to shave your face.  Please follow these instructions carefully.   Shower the NIGHT BEFORE SURGERY and the MORNING OF SURGERY  If you chose to wash your hair, wash your hair first as usual with your normal shampoo.  After you shampoo, rinse your hair and body thoroughly to remove the shampoo.  Use CHG Soap as you would any other liquid soap. You can apply CHG directly to the skin and wash gently with a scrungie or a clean washcloth.   Apply  the CHG Soap to your body ONLY FROM THE NECK DOWN.  Do not use on open wounds or open sores. Avoid contact with your eyes, ears, mouth and genitals (private parts). Wash Face and genitals (private parts)  with your normal soap.   Wash thoroughly, paying special attention to the area where your surgery will be performed.  Thoroughly rinse your body with warm water from the neck down.  DO NOT shower/wash with your normal soap after using and rinsing off the CHG Soap.  Pat yourself dry with a CLEAN TOWEL.  Wear CLEAN PAJAMAS to bed the night before surgery  Place CLEAN SHEETS on your bed the night before your surgery  DO  NOT SLEEP WITH PETS.   Day of Surgery: Take a shower with CHG soap. Do not wear jewelry  Do not wear lotions, powders, colognes, or deodorant. Men may shave face and neck. Do not bring valuables to the hospital. [facility] is not responsible for any belongings or valuables.  Wear Clean/Comfortable clothing the morning of surgery Remember to brush your teeth WITH YOUR REGULAR TOOTHPASTE.   Please read over the following fact sheets that you were given.    If you received a COVID test during your pre-op visit  it is requested that you wear a mask when out in public, stay away from anyone that may not be feeling well and notify your surgeon if you develop symptoms. If you have been in contact with anyone that has tested positive in the last 10 days please notify you surgeon.

## 2022-07-03 ENCOUNTER — Encounter (HOSPITAL_COMMUNITY): Payer: Self-pay

## 2022-07-03 ENCOUNTER — Other Ambulatory Visit: Payer: Self-pay

## 2022-07-03 ENCOUNTER — Encounter (HOSPITAL_COMMUNITY)
Admission: RE | Admit: 2022-07-03 | Discharge: 2022-07-03 | Disposition: A | Discharge status: Home / Self Care | Payer: Medicare Other | Source: Ambulatory Visit | Attending: Neurological Surgery | Admitting: Neurological Surgery

## 2022-07-03 VITALS — BP 164/94 | HR 102 | Temp 97.6°F | Resp 17 | Ht 70.0 in | Wt 214.0 lb

## 2022-07-03 DIAGNOSIS — Z01818 Encounter for other preprocedural examination: Secondary | ICD-10-CM

## 2022-07-03 DIAGNOSIS — I454 Nonspecific intraventricular block: Secondary | ICD-10-CM | POA: Diagnosis not present

## 2022-07-03 DIAGNOSIS — Z01812 Encounter for preprocedural laboratory examination: Secondary | ICD-10-CM | POA: Insufficient documentation

## 2022-07-03 LAB — SURGICAL PCR SCREEN
MRSA, PCR: NEGATIVE
Staphylococcus aureus: NEGATIVE

## 2022-07-03 LAB — CBC
HCT: 41.3 % (ref 39.0–52.0)
Hemoglobin: 14.4 g/dL (ref 13.0–17.0)
MCH: 32.5 pg (ref 26.0–34.0)
MCHC: 34.9 g/dL (ref 30.0–36.0)
MCV: 93.2 fL (ref 80.0–100.0)
Platelets: 217 10*3/uL (ref 150–400)
RBC: 4.43 MIL/uL (ref 4.22–5.81)
RDW: 11.9 % (ref 11.5–15.5)
WBC: 7.3 10*3/uL (ref 4.0–10.5)
nRBC: 0 % (ref 0.0–0.2)

## 2022-07-03 LAB — BASIC METABOLIC PANEL
Anion gap: 13 (ref 5–15)
BUN: 15 mg/dL (ref 8–23)
CO2: 22 mmol/L (ref 22–32)
Calcium: 9.2 mg/dL (ref 8.9–10.3)
Chloride: 102 mmol/L (ref 98–111)
Creatinine, Ser: 0.88 mg/dL (ref 0.61–1.24)
GFR, Estimated: >60 mL/min (ref >60)
Glucose, Bld: 98 mg/dL (ref 70–99)
Potassium: 3.6 mmol/L (ref 3.5–5.1)
Sodium: 137 mmol/L (ref 135–145)

## 2022-07-03 LAB — TYPE AND SCREEN
ABO/RH(D): A POS
Antibody Screen: NEGATIVE

## 2022-07-03 NOTE — Progress Notes (Signed)
PCP - Carolann Littler MD Cardiologist - [name] MD  PPM/ICD -denies  Device Orders -  Rep Notified -   Chest x-ray - none EKG - 06/08/22 Stress Test - none ECHO - none Cardiac Cath - none  Sleep Study - none CPAP -   Fasting Blood Sugar - na Checks Blood Sugar _____ times a day  Last dose of GLP1 agonist-  na GLP1 instructions: na  Blood Thinner Instructions:na   Aspirin Instructions:na  ERAS Protcol -no PRE-SURGERY Ensure or G2-   COVID TEST- NA   Anesthesia review: yes- cardiac clearance for bundle branch block  Patient denies shortness of breath, fever, cough and chest pain at PAT appointment   All instructions explained to the patient, with a verbal understanding of the material. Patient agrees to go over the instructions while at home for a better understanding. Patient also instructed to wear a mask when out in public prior to surgery. . The opportunity to ask questions was provided.

## 2022-07-06 NOTE — Anesthesia Preprocedure Evaluation (Signed)
Anesthesia Evaluation  Patient identified by MRN, date of birth, ID band Patient awake    Reviewed: Allergy & Precautions, H&P , NPO status , Patient's Chart, lab work & pertinent test results  Airway Mallampati: II  TM Distance: >3 FB Neck ROM: Full    Dental no notable dental hx. (+) Teeth Intact, Dental Advisory Given   Pulmonary neg pulmonary ROS, former smoker   Pulmonary exam normal breath sounds clear to auscultation       Cardiovascular negative cardio ROS  Rhythm:Regular Rate:Normal     Neuro/Psych    Depression    negative neurological ROS     GI/Hepatic negative GI ROS, Neg liver ROS,,,  Endo/Other  negative endocrine ROS    Renal/GU negative Renal ROS  negative genitourinary   Musculoskeletal   Abdominal   Peds  Hematology negative hematology ROS (+)   Anesthesia Other Findings   Reproductive/Obstetrics negative OB ROS                             Anesthesia Physical Anesthesia Plan  ASA: 2  Anesthesia Plan: General   Post-op Pain Management: Tylenol PO (pre-op)*   Induction: Intravenous  PONV Risk Score and Plan: 3 and Ondansetron, Dexamethasone and Treatment may vary due to age or medical condition  Airway Management Planned: Oral ETT  Additional Equipment:   Intra-op Plan:   Post-operative Plan: Extubation in OR  Informed Consent: I have reviewed the patients History and Physical, chart, labs and discussed the procedure including the risks, benefits and alternatives for the proposed anesthesia with the patient or authorized representative who has indicated his/her understanding and acceptance.     Dental advisory given  Plan Discussed with: CRNA  Anesthesia Plan Comments: (PAT note by Karoline Caldwell, PA-C: Follows with cardiology for hx of abn EKG. Recently evaluated by Dr. Martinique 06/17/22 at the request of his PCP for EKG showing RBBB and LAFB. Per note,  "Patient has a bifascicular block with RBBBB and LAFB. No evidence of significant AV block. No cardiac symptoms and normal cardiac exam. Apparently BBB present for at least 2 years. Patient has a very low operative risk for planned spinal surgery. No further CV testing needed. He may proceed. Will follow up PRN."  Preop labs reviewed, WNL.   07/03/2022  Height  '5\' 10"'$  Weight   214 lbs BMI   26.33 Systolic  354  Diastolic  94  Pulse   562    EKG 06/08/22: Sinus Tachycardia. Rate106. -Right bundle branch block with left axis -bifascicular block.  )        Anesthesia Quick Evaluation

## 2022-07-06 NOTE — Progress Notes (Signed)
Anesthesia Chart Review:  Follows with cardiology for hx of abn EKG. Recently evaluated by Dr. Martinique 06/17/22 at the request of his PCP for EKG showing RBBB and LAFB. Per note, "Patient has a bifascicular block with RBBBB and LAFB. No evidence of significant AV block. No cardiac symptoms and normal cardiac exam. Apparently BBB present for at least 2 years. Patient has a very low operative risk for planned spinal surgery. No further CV testing needed. He may proceed. Will follow up PRN."  Preop labs reviewed, WNL.     07/03/2022    2:02 PM  Vitals with BMI  Height '5\' 10"'$   Weight 214 lbs  BMI 13.14  Systolic 388  Diastolic 94  Pulse 875    EKG 06/08/22: Sinus Tachycardia. Rate106. -Right bundle branch block with left axis -bifascicular block.   Wynonia Musty [facility] Stay [facility]/Anesthesiology Phone [phone] 07/06/2022 8:51 AM

## 2022-07-09 ENCOUNTER — Ambulatory Visit (HOSPITAL_BASED_OUTPATIENT_CLINIC_OR_DEPARTMENT_OTHER): Payer: Medicare Other | Admitting: Physician Assistant

## 2022-07-09 ENCOUNTER — Other Ambulatory Visit: Payer: Self-pay

## 2022-07-09 ENCOUNTER — Ambulatory Visit (HOSPITAL_COMMUNITY): Payer: Medicare Other | Admitting: Physician Assistant

## 2022-07-09 ENCOUNTER — Observation Stay (HOSPITAL_COMMUNITY)
Admission: RE | Admit: 2022-07-09 | Discharge: 2022-07-10 | Disposition: A | Discharge status: Home / Self Care | Payer: Medicare Other | Attending: Neurological Surgery | Admitting: Neurological Surgery

## 2022-07-09 ENCOUNTER — Ambulatory Visit (HOSPITAL_COMMUNITY): Payer: Medicare Other

## 2022-07-09 ENCOUNTER — Encounter (HOSPITAL_COMMUNITY): Payer: Self-pay | Admitting: Neurological Surgery

## 2022-07-09 ENCOUNTER — Encounter (HOSPITAL_COMMUNITY)
Admission: RE | Disposition: A | Discharge status: Home / Self Care | Payer: Self-pay | Source: Home / Self Care | Attending: Neurological Surgery

## 2022-07-09 DIAGNOSIS — Z7952 Long term (current) use of systemic steroids: Secondary | ICD-10-CM | POA: Insufficient documentation

## 2022-07-09 DIAGNOSIS — M4317 Spondylolisthesis, lumbosacral region: Secondary | ICD-10-CM | POA: Diagnosis not present

## 2022-07-09 DIAGNOSIS — M4726 Other spondylosis with radiculopathy, lumbar region: Secondary | ICD-10-CM

## 2022-07-09 DIAGNOSIS — Z85828 Personal history of other malignant neoplasm of skin: Secondary | ICD-10-CM | POA: Insufficient documentation

## 2022-07-09 DIAGNOSIS — M48061 Spinal stenosis, lumbar region without neurogenic claudication: Secondary | ICD-10-CM | POA: Diagnosis not present

## 2022-07-09 DIAGNOSIS — Z87891 Personal history of nicotine dependence: Secondary | ICD-10-CM | POA: Insufficient documentation

## 2022-07-09 DIAGNOSIS — M4316 Spondylolisthesis, lumbar region: Secondary | ICD-10-CM

## 2022-07-09 DIAGNOSIS — Z981 Arthrodesis status: Secondary | ICD-10-CM | POA: Diagnosis not present

## 2022-07-09 DIAGNOSIS — Z79899 Other long term (current) drug therapy: Secondary | ICD-10-CM | POA: Insufficient documentation

## 2022-07-09 HISTORY — PX: TRANSFORAMINAL LUMBAR INTERBODY FUSION W/ MIS 1 LEVEL: SHX6145

## 2022-07-09 LAB — ABO/RH: ABO/RH(D): A POS

## 2022-07-09 SURGERY — MINIMALLY INVASIVE (MIS) TRANSFORAMINAL LUMBAR INTERBODY FUSION (TLIF) 1 LEVEL
Anesthesia: General | Laterality: Left

## 2022-07-09 MED ORDER — SUFENTANIL CITRATE 50 MCG/ML IV SOLN
INTRAVENOUS | Status: AC
  Filled 2022-07-09: qty 1

## 2022-07-09 MED ORDER — ROCURONIUM BROMIDE 10 MG/ML (PF) SYRINGE
PREFILLED_SYRINGE | INTRAVENOUS | Status: AC
  Filled 2022-07-09: qty 10

## 2022-07-09 MED ORDER — KETOROLAC TROMETHAMINE 15 MG/ML IJ SOLN
INTRAMUSCULAR | Status: AC
  Filled 2022-07-09: qty 1

## 2022-07-09 MED ORDER — LABETALOL HCL 5 MG/ML IV SOLN
INTRAVENOUS | Status: AC
  Filled 2022-07-09: qty 4

## 2022-07-09 MED ORDER — LABETALOL HCL 5 MG/ML IV SOLN: 10.0000 mg | Freq: Once | INTRAVENOUS | Status: DC

## 2022-07-09 MED ORDER — LABETALOL HCL 5 MG/ML IV SOLN
10.0000 mg | INTRAVENOUS | Status: AC | PRN
  Administered 2022-07-09 (×2): 10 mg via INTRAVENOUS

## 2022-07-09 MED ORDER — KETOROLAC TROMETHAMINE 15 MG/ML IJ SOLN
7.5000 mg | Freq: Four times a day (QID) | INTRAMUSCULAR | Status: AC
  Administered 2022-07-09 – 2022-07-10 (×4): 7.5 mg via INTRAVENOUS
  Filled 2022-07-09 (×3): qty 1

## 2022-07-09 MED ORDER — ONDANSETRON HCL 4 MG/2ML IJ SOLN
INTRAMUSCULAR | Status: AC
  Filled 2022-07-09: qty 2

## 2022-07-09 MED ORDER — HEMOSTATIC AGENTS (NO CHARGE) OPTIME
TOPICAL | Status: DC | PRN
  Administered 2022-07-09: 1 via TOPICAL

## 2022-07-09 MED ORDER — BUPIVACAINE LIPOSOME 1.3 % IJ SUSP
INTRAMUSCULAR | Status: AC
  Filled 2022-07-09: qty 20

## 2022-07-09 MED ORDER — ONDANSETRON HCL 4 MG PO TABS: 4.0000 mg | ORAL_TABLET | Freq: Four times a day (QID) | ORAL | Status: DC | PRN

## 2022-07-09 MED ORDER — CEFAZOLIN SODIUM-DEXTROSE 2-4 GM/100ML-% IV SOLN
2.0000 g | Freq: Three times a day (TID) | INTRAVENOUS | Status: AC
  Administered 2022-07-09 – 2022-07-10 (×2): 2 g via INTRAVENOUS
  Filled 2022-07-09 (×2): qty 100

## 2022-07-09 MED ORDER — SODIUM CHLORIDE 0.9% FLUSH: 3.0000 mL | INTRAVENOUS | Status: DC | PRN

## 2022-07-09 MED ORDER — ACETAMINOPHEN 10 MG/ML IV SOLN
1000.0000 mg | Freq: Once | INTRAVENOUS | Status: AC
  Administered 2022-07-09: 1000 mg via INTRAVENOUS

## 2022-07-09 MED ORDER — SODIUM CHLORIDE 0.9% FLUSH
3.0000 mL | Freq: Two times a day (BID) | INTRAVENOUS | Status: DC
  Administered 2022-07-10: 3 mL via INTRAVENOUS

## 2022-07-09 MED ORDER — ONDANSETRON HCL 4 MG/2ML IJ SOLN: 4.0000 mg | Freq: Four times a day (QID) | INTRAMUSCULAR | Status: DC | PRN

## 2022-07-09 MED ORDER — HYDROMORPHONE HCL 1 MG/ML IJ SOLN
0.2500 mg | INTRAMUSCULAR | Status: DC | PRN
  Administered 2022-07-09 (×5): 0.5 mg via INTRAVENOUS

## 2022-07-09 MED ORDER — CHLORHEXIDINE GLUCONATE CLOTH 2 % EX PADS: 6.0000 | MEDICATED_PAD | Freq: Once | CUTANEOUS | Status: DC

## 2022-07-09 MED ORDER — 0.9 % SODIUM CHLORIDE (POUR BTL) OPTIME
TOPICAL | Status: DC | PRN
  Administered 2022-07-09: 1000 mL

## 2022-07-09 MED ORDER — PROPOFOL 10 MG/ML IV BOLUS
INTRAVENOUS | Status: DC | PRN
  Administered 2022-07-09: 50 mg via INTRAVENOUS
  Administered 2022-07-09: 150 mg via INTRAVENOUS

## 2022-07-09 MED ORDER — ROCURONIUM BROMIDE 10 MG/ML (PF) SYRINGE
PREFILLED_SYRINGE | INTRAVENOUS | Status: DC | PRN
  Administered 2022-07-09: 60 mg via INTRAVENOUS

## 2022-07-09 MED ORDER — LIDOCAINE 2% (20 MG/ML) 5 ML SYRINGE
INTRAMUSCULAR | Status: DC | PRN
  Administered 2022-07-09: 60 mg via INTRAVENOUS

## 2022-07-09 MED ORDER — PHENYLEPHRINE 80 MCG/ML (10ML) SYRINGE FOR IV PUSH (FOR BLOOD PRESSURE SUPPORT)
PREFILLED_SYRINGE | INTRAVENOUS | Status: DC | PRN
  Administered 2022-07-09: 160 ug via INTRAVENOUS
  Administered 2022-07-09 (×2): 80 ug via INTRAVENOUS

## 2022-07-09 MED ORDER — HYDROMORPHONE HCL 1 MG/ML IJ SOLN
INTRAMUSCULAR | Status: AC
  Filled 2022-07-09: qty 1

## 2022-07-09 MED ORDER — FENTANYL CITRATE (PF) 100 MCG/2ML IJ SOLN
25.0000 ug | INTRAMUSCULAR | Status: DC | PRN
  Administered 2022-07-09 (×3): 50 ug via INTRAVENOUS

## 2022-07-09 MED ORDER — OXYCODONE HCL 5 MG PO TABS
10.0000 mg | ORAL_TABLET | ORAL | Status: DC | PRN
  Administered 2022-07-09 – 2022-07-10 (×3): 10 mg via ORAL
  Filled 2022-07-09 (×4): qty 2

## 2022-07-09 MED ORDER — ORAL CARE MOUTH RINSE: 15.0000 mL | Freq: Once | OROMUCOSAL | Status: AC

## 2022-07-09 MED ORDER — HYDROMORPHONE HCL 1 MG/ML IJ SOLN
0.5000 mg | INTRAMUSCULAR | Status: DC | PRN
  Administered 2022-07-09: 0.5 mg via INTRAVENOUS

## 2022-07-09 MED ORDER — HYDROMORPHONE HCL 1 MG/ML IJ SOLN
0.5000 mg | INTRAMUSCULAR | Status: DC | PRN
  Administered 2022-07-09 – 2022-07-10 (×2): 0.5 mg via INTRAVENOUS
  Filled 2022-07-09 (×2): qty 0.5

## 2022-07-09 MED ORDER — METHOCARBAMOL 500 MG PO TABS
500.0000 mg | ORAL_TABLET | Freq: Four times a day (QID) | ORAL | Status: DC | PRN
  Administered 2022-07-09 – 2022-07-10 (×3): 500 mg via ORAL
  Filled 2022-07-09 (×2): qty 1

## 2022-07-09 MED ORDER — PHENYLEPHRINE 80 MCG/ML (10ML) SYRINGE FOR IV PUSH (FOR BLOOD PRESSURE SUPPORT)
PREFILLED_SYRINGE | INTRAVENOUS | Status: AC
  Filled 2022-07-09: qty 10

## 2022-07-09 MED ORDER — PHENOL 1.4 % MT LIQD: 1.0000 | OROMUCOSAL | Status: DC | PRN

## 2022-07-09 MED ORDER — ACETAMINOPHEN 500 MG PO TABS: 1000.0000 mg | ORAL_TABLET | ORAL | Status: AC

## 2022-07-09 MED ORDER — MIDAZOLAM HCL 2 MG/2ML IJ SOLN
INTRAMUSCULAR | Status: AC
  Filled 2022-07-09: qty 2

## 2022-07-09 MED ORDER — LIDOCAINE-EPINEPHRINE 1 %-1:100000 IJ SOLN
INTRAMUSCULAR | Status: AC
  Filled 2022-07-09: qty 1

## 2022-07-09 MED ORDER — SUFENTANIL CITRATE 50 MCG/ML IV SOLN
INTRAVENOUS | Status: DC | PRN
  Administered 2022-07-09: 5 ug via INTRAVENOUS
  Administered 2022-07-09: 10 ug via INTRAVENOUS
  Administered 2022-07-09: 5 ug via INTRAVENOUS
  Administered 2022-07-09: 10 ug via INTRAVENOUS

## 2022-07-09 MED ORDER — BUPIVACAINE-EPINEPHRINE (PF) 0.5% -1:200000 IJ SOLN
INTRAMUSCULAR | Status: DC | PRN
  Administered 2022-07-09: 7 mL via PERINEURAL

## 2022-07-09 MED ORDER — CHLORHEXIDINE GLUCONATE 0.12 % MT SOLN
15.0000 mL | Freq: Once | OROMUCOSAL | Status: AC
  Administered 2022-07-09: 15 mL via OROMUCOSAL
  Filled 2022-07-09: qty 15

## 2022-07-09 MED ORDER — ACETAMINOPHEN 10 MG/ML IV SOLN
INTRAVENOUS | Status: AC
  Filled 2022-07-09: qty 100

## 2022-07-09 MED ORDER — METHOCARBAMOL 500 MG PO TABS
ORAL_TABLET | ORAL | Status: AC
  Filled 2022-07-09: qty 1

## 2022-07-09 MED ORDER — FENTANYL CITRATE (PF) 100 MCG/2ML IJ SOLN
INTRAMUSCULAR | Status: AC
  Filled 2022-07-09: qty 2

## 2022-07-09 MED ORDER — LACTATED RINGERS IV SOLN: INTRAVENOUS | Status: DC

## 2022-07-09 MED ORDER — LIDOCAINE-EPINEPHRINE 1 %-1:100000 IJ SOLN
INTRAMUSCULAR | Status: DC | PRN
  Administered 2022-07-09: 7 mL

## 2022-07-09 MED ORDER — METHOCARBAMOL 1000 MG/10ML IJ SOLN: 500.0000 mg | Freq: Four times a day (QID) | INTRAVENOUS | Status: DC | PRN

## 2022-07-09 MED ORDER — SODIUM CHLORIDE 0.9 % IV SOLN
250.0000 mL | INTRAVENOUS | Status: DC
  Administered 2022-07-09: 250 mL via INTRAVENOUS

## 2022-07-09 MED ORDER — THROMBIN 5000 UNITS EX SOLR
OROMUCOSAL | Status: DC | PRN
  Administered 2022-07-09: 5 mL via TOPICAL

## 2022-07-09 MED ORDER — MIDAZOLAM HCL 2 MG/2ML IJ SOLN
INTRAMUSCULAR | Status: DC | PRN
  Administered 2022-07-09: 2 mg via INTRAVENOUS

## 2022-07-09 MED ORDER — CEFAZOLIN SODIUM-DEXTROSE 2-4 GM/100ML-% IV SOLN
2.0000 g | INTRAVENOUS | Status: AC
  Administered 2022-07-09: 2 g via INTRAVENOUS
  Filled 2022-07-09: qty 100

## 2022-07-09 MED ORDER — OXYCODONE HCL 5 MG PO TABS: 5.0000 mg | ORAL_TABLET | ORAL | Status: DC | PRN

## 2022-07-09 MED ORDER — ACETAMINOPHEN 500 MG PO TABS
ORAL_TABLET | ORAL | Status: AC
  Administered 2022-07-09: 1000 mg via ORAL
  Filled 2022-07-09: qty 2

## 2022-07-09 MED ORDER — BUPIVACAINE LIPOSOME 1.3 % IJ SUSP
INTRAMUSCULAR | Status: DC | PRN
  Administered 2022-07-09: 20 mL

## 2022-07-09 MED ORDER — ACETAMINOPHEN 325 MG PO TABS
650.0000 mg | ORAL_TABLET | ORAL | Status: DC | PRN
  Administered 2022-07-10: 650 mg via ORAL
  Filled 2022-07-09 (×2): qty 2

## 2022-07-09 MED ORDER — SODIUM CHLORIDE (PF) 0.9 % IJ SOLN
INTRAMUSCULAR | Status: AC
  Filled 2022-07-09: qty 20

## 2022-07-09 MED ORDER — PROPOFOL 10 MG/ML IV BOLUS
INTRAVENOUS | Status: AC
  Filled 2022-07-09: qty 20

## 2022-07-09 MED ORDER — DOCUSATE SODIUM 100 MG PO CAPS
100.0000 mg | ORAL_CAPSULE | Freq: Two times a day (BID) | ORAL | Status: DC
  Administered 2022-07-09 – 2022-07-10 (×2): 100 mg via ORAL
  Filled 2022-07-09 (×2): qty 1

## 2022-07-09 MED ORDER — ONDANSETRON HCL 4 MG/2ML IJ SOLN
INTRAMUSCULAR | Status: DC | PRN
  Administered 2022-07-09: 4 mg via INTRAVENOUS

## 2022-07-09 MED ORDER — DEXAMETHASONE SODIUM PHOSPHATE 10 MG/ML IJ SOLN
INTRAMUSCULAR | Status: DC | PRN
  Administered 2022-07-09: 10 mg via INTRAVENOUS

## 2022-07-09 MED ORDER — THROMBIN 5000 UNITS EX SOLR
CUTANEOUS | Status: AC
  Filled 2022-07-09: qty 5000

## 2022-07-09 MED ORDER — ACETAMINOPHEN 650 MG RE SUPP: 650.0000 mg | RECTAL | Status: DC | PRN

## 2022-07-09 MED ORDER — PHENYLEPHRINE HCL-NACL 20-0.9 MG/250ML-% IV SOLN
INTRAVENOUS | Status: DC | PRN
  Administered 2022-07-09: 50 ug/min via INTRAVENOUS
  Administered 2022-07-09: 30 ug/min via INTRAVENOUS

## 2022-07-09 MED ORDER — MENTHOL 3 MG MT LOZG
1.0000 | LOZENGE | OROMUCOSAL | Status: DC | PRN
  Filled 2022-07-09: qty 9

## 2022-07-09 MED ORDER — DEXAMETHASONE SODIUM PHOSPHATE 10 MG/ML IJ SOLN
INTRAMUSCULAR | Status: AC
  Filled 2022-07-09: qty 1

## 2022-07-09 MED ORDER — BUPIVACAINE-EPINEPHRINE (PF) 0.5% -1:200000 IJ SOLN
INTRAMUSCULAR | Status: AC
  Filled 2022-07-09: qty 30

## 2022-07-09 MED ORDER — LIDOCAINE 2% (20 MG/ML) 5 ML SYRINGE
INTRAMUSCULAR | Status: AC
  Filled 2022-07-09: qty 5

## 2022-07-09 SURGICAL SUPPLY — 76 items
BAG COUNTER SPONGE SURGICOUNT (BAG) ×1 IMPLANT
BAND RUBBER #18 3X1/16 STRL (MISCELLANEOUS) ×2 IMPLANT
BASKET BONE COLLECTION (BASKET) ×1 IMPLANT
BUR MATCHSTICK NEURO 3.0 LAGG (BURR) ×1 IMPLANT
CAGE INTERBODY PL SHT 7X22.5 (Plate) IMPLANT
CNTNR URN SCR LID CUP LEK RST (MISCELLANEOUS) ×1 IMPLANT
CONT SPEC 4OZ STRL OR WHT (MISCELLANEOUS) ×1
COVER BACK TABLE 60X90IN (DRAPES) ×1 IMPLANT
COVER MAYO STAND STRL (DRAPES) ×1 IMPLANT
DERMABOND ADVANCED .7 DNX12 (GAUZE/BANDAGES/DRESSINGS) IMPLANT
DRAIN JACKSON RD 7FR 3/32 (WOUND CARE) IMPLANT
DRAPE C-ARM 42X72 X-RAY (DRAPES) ×2 IMPLANT
DRAPE C-ARMOR (DRAPES) IMPLANT
DRAPE LAPAROTOMY 100X72X124 (DRAPES) ×1 IMPLANT
DRAPE MICROSCOPE SLANT 54X150 (MISCELLANEOUS) ×1 IMPLANT
DRAPE SHEET LG 3/4 BI-LAMINATE (DRAPES) IMPLANT
DRAPE STERI IOBAN 125X83 (DRAPES) IMPLANT
DRAPE SURG 17X23 STRL (DRAPES) ×1 IMPLANT
DRSG OPSITE POSTOP 3X4 (GAUZE/BANDAGES/DRESSINGS) IMPLANT
DURAPREP 26ML APPLICATOR (WOUND CARE) ×1 IMPLANT
ELECT BLADE INSULATED 6.5IN (ELECTROSURGICAL) ×1
ELECT REM PT RETURN 9FT ADLT (ELECTROSURGICAL) ×1
ELECTRODE BLDE INSULATED 6.5IN (ELECTROSURGICAL) ×1 IMPLANT
ELECTRODE REM PT RTRN 9FT ADLT (ELECTROSURGICAL) ×1 IMPLANT
EXTENDER TAB GUIDE SV 5.5/6.0 (INSTRUMENTS) IMPLANT
GAUZE 4X4 16PLY ~~LOC~~+RFID DBL (SPONGE) IMPLANT
GAUZE SPONGE 4X4 12PLY STRL (GAUZE/BANDAGES/DRESSINGS) ×1 IMPLANT
GLOVE BIOGEL PI IND STRL 8 (GLOVE) ×2 IMPLANT
GLOVE ECLIPSE 8.0 STRL XLNG CF (GLOVE) ×2 IMPLANT
GLOVE SURG ENC MOIS LTX SZ8 (GLOVE) ×1 IMPLANT
GLOVE SURG UNDER POLY LF SZ8.5 (GLOVE) ×1 IMPLANT
GOWN STRL REUS W/ TWL LRG LVL3 (GOWN DISPOSABLE) IMPLANT
GOWN STRL REUS W/ TWL XL LVL3 (GOWN DISPOSABLE) ×2 IMPLANT
GOWN STRL REUS W/TWL 2XL LVL3 (GOWN DISPOSABLE) IMPLANT
GOWN STRL REUS W/TWL LRG LVL3 (GOWN DISPOSABLE)
GOWN STRL REUS W/TWL XL LVL3 (GOWN DISPOSABLE) ×2
GUIDEWIRE SHARP NITINOL 610 (WIRE) IMPLANT
HEMOSTAT POWDER KIT SURGIFOAM (HEMOSTASIS) ×1 IMPLANT
KIT BASIN OR (CUSTOM PROCEDURE TRAY) ×1 IMPLANT
KIT INFUSE XX SMALL 0.7CC (Orthopedic Implant) IMPLANT
KIT POSITION SURG JACKSON T1 (MISCELLANEOUS) ×1 IMPLANT
KIT TURNOVER KIT B (KITS) ×1 IMPLANT
KNIFE SURG 188MM BAYONET LONG (INSTRUMENTS) IMPLANT
MARKER SKIN DUAL TIP RULER LAB (MISCELLANEOUS) ×1 IMPLANT
NDL ASPIRATION RAN815N 8X15 (NEEDLE) IMPLANT
NDL HYPO 21X1.5 SAFETY (NEEDLE) ×1 IMPLANT
NDL HYPO 25X1 1.5 SAFETY (NEEDLE) ×1 IMPLANT
NDL SPNL 22GX3.5 QUINCKE BK (NEEDLE) ×1 IMPLANT
NEEDLE ASPIRATION RAN815N 8X15 (NEEDLE) ×1 IMPLANT
NEEDLE HYPO 21X1.5 SAFETY (NEEDLE) ×1 IMPLANT
NEEDLE HYPO 25X1 1.5 SAFETY (NEEDLE) ×1 IMPLANT
NEEDLE SPNL 22GX3.5 QUINCKE BK (NEEDLE) ×1 IMPLANT
NS IRRIG 1000ML POUR BTL (IV SOLUTION) ×1 IMPLANT
PACK LAMINECTOMY NEURO (CUSTOM PROCEDURE TRAY) ×1 IMPLANT
PAD ARMBOARD 7.5X6 YLW CONV (MISCELLANEOUS) ×3 IMPLANT
PATTIES SURGICAL .5 X.5 (GAUZE/BANDAGES/DRESSINGS) IMPLANT
PATTIES SURGICAL .5 X1 (DISPOSABLE) IMPLANT
PATTIES SURGICAL 1X1 (DISPOSABLE) IMPLANT
PUTTY GRAFTON DBF 6CC W/DELIVE (Putty) IMPLANT
ROD PERC CCM 5.5X35 (Rod) IMPLANT
SCREW 5.5 VOYAGER MAS 7.5X50 (Screw) IMPLANT
SCREW MAS VOYAGER 7.5X45 (Screw) IMPLANT
SCREW SET 5.5/6.0MM SOLERA (Screw) IMPLANT
SPIKE FLUID TRANSFER (MISCELLANEOUS) ×1 IMPLANT
SPONGE SURGIFOAM ABS GEL SZ50 (HEMOSTASIS) ×1 IMPLANT
SPONGE T-LAP 4X18 ~~LOC~~+RFID (SPONGE) IMPLANT
STAPLER VISISTAT 35W (STAPLE) IMPLANT
SUT VIC AB 0 CT1 18XCR BRD8 (SUTURE) ×1 IMPLANT
SUT VIC AB 0 CT1 8-18 (SUTURE) ×1
SUT VIC AB 2-0 CP2 18 (SUTURE) ×1 IMPLANT
SUT VIC AB 3-0 SH 8-18 (SUTURE) ×1 IMPLANT
SYR 20CC LL (SYRINGE) ×1 IMPLANT
TOWEL GREEN STERILE (TOWEL DISPOSABLE) ×1 IMPLANT
TOWEL GREEN STERILE FF (TOWEL DISPOSABLE) ×1 IMPLANT
TRAY FOLEY MTR SLVR 16FR STAT (SET/KITS/TRAYS/PACK) ×1 IMPLANT
WATER STERILE IRR 1000ML POUR (IV SOLUTION) ×1 IMPLANT

## 2022-07-09 NOTE — H&P (Addendum)
Providing Compassionate, Quality Care - Together  NEUROSURGERY HISTORY & PHYSICAL   Eric [name] is an 71 y.o. male.   Chief Complaint: Left lower extremity radiculopathy HPI: This is a pleasant 71 year old male with a history of chronic low back pain and worsening left lower extremity radiculopathy that failed conservative measures and presents today for surgical intervention.  Is a history of L5-S1 degenerative disc disease, isthmic spondylolisthesis, spondylosis and severe left and right foraminal stenosis.  MRI revealed compression of the L5 nerve root in the foramen, given this I recommended surgery in the form of an L5-S1 left-sided decompression, transforaminal lumbar interbody fusion.  All risks benefits and expected outcomes were discussed and agreed upon.  He presents today for intervention.  Past Medical History:  Diagnosis Date   Anal fissure    Chicken pox    DEPRESSION 12/25/2008   Dyslipidemia    LIBIDO, DECREASED 12/25/2008   SKIN CANCER, HX OF 12/25/2008    Past Surgical History:  Procedure Laterality Date   FRACTURE SURGERY Right 04/2009   right fibula fracture-plate and 6 screws   KNEE ARTHROSCOPY  1994, 1996   right, left   NASAL POLYP SURGERY  2004, 2009, 2012   x 4   SHOULDER ARTHROSCOPY W/ ROTATOR CUFF REPAIR  07/21/2003   TONSILLECTOMY      Family History  Problem Relation Age of Onset   Alzheimer's disease Father 46   Cancer Father        melanoma   Multiple sclerosis Brother    Parkinson's disease Brother    Social History:  reports that he quit smoking about 15 years ago. His smoking use included cigarettes. He has never used smokeless tobacco. He reports current alcohol use. He reports that he does not use drugs.  Allergies:  Allergies  Allergen Reactions   Ciprofloxacin Diarrhea    REACTION: GI upset, C-diff   Metronidazole Hives   Flagyl [Metronidazole Hcl] Rash    Medications Prior to Admission  Medication Sig Dispense Refill    cetirizine (ZYRTEC) 10 MG tablet Take 10 mg by mouth daily.     FIBER PO Take 1 tablet by mouth daily.     fluticasone (FLONASE) 50 MCG/ACT nasal spray Place 2 sprays into both nostrils daily.     Multiple Vitamin (MULTIVITAMIN WITH MINERALS) TABS tablet Take 1 tablet by mouth daily.     naproxen sodium (ALEVE) 220 MG tablet Take 220 mg by mouth in the morning.     tadalafil (CIALIS) 20 MG tablet TAKE 1 TABLET BY MOUTH EVERY OTHER DAY AS NEEDED FOR ERECTILE DYSFUNCTION 30 tablet 1    Results for orders placed or performed during the hospital encounter of 07/09/22 (from the past 48 hour(s))  ABO/Rh     Status: None (Preliminary result)   Collection Time: 07/09/22  9:10 AM  Result Value Ref Range   ABO/RH(D) PENDING    No results found.  ROS All positives and negatives mentioned HPI  Blood pressure (!) 166/94, pulse 91, temperature 97.6 F (36.4 C), temperature source Oral, resp. rate 18, height '5\' 10"'$  (1.778 m), weight 95.3 kg, SpO2 97 %. Physical Exam  Awake alert Orient x 3, no acute distress PERRLA Cranial Nerves II through XII intact Bilateral upper extremities 5/5 throughout Bilateral lower extremities 5/5 throughout except for left lower extremity plantarflexion 4+/5 Decrease sensation light touch in the L5 distribution on the left Speech fluent and appropriate Nonlabored breathing  Assessment/Plan  71 year old male with L5-S1 degenerative spondylosis,  isthmic spondylolisthesis, foraminal stenosis with left lower extremity radiculopathy   -OR today for left L5-S1 minimally invasive decompression and transforaminal lumbar interbody fusion.  All risks benefits and alternatives were discussed and agreed upon.  Informed consent was obtained and witnessed.  Thank you for allowing me to participate in this patient's care.  Please do not hesitate to call with questions or concerns.   Elwin Sleight, [facility] Cell:  [phone]

## 2022-07-09 NOTE — Op Note (Signed)
Providing Compassionate, Quality Care - Together  Date of service: 07/09/2022  PREOP DIAGNOSIS:  Isthmic L5-S1 spondylolisthesis with severe foraminal stenosis, left lower extremity radiculopathy, lumbar spondylosis   POSTOP DIAGNOSIS: Same   PROCEDURE: Minimally invasive L 5-S1 decompression and transforaminal lumbar interbody arthrodesis with posterolateral arthrodesis, left sided approach Bilateral L5, S1 nonsegmental pedicle screw instrumentation, Medtronic Solera 7.5 mm x 50 mm bilaterally at L5, 7.5 x 45 mm bilaterally at S1 Placement of anterior biomechanical device, Medtronic catalyft 7 x 22 mm, expanded to 9 mm in height Intraoperative use of autograft, same incision Intraoperative use of allograft  Intraoperative use of BMP, Xxs Intraoperative use of fluoroscopy, greater than 1 hour Intraoperative use of microscope, for microdissection   SURGEON: Dr. Pieter Partridge C. Felisa Zechman, DO   ASSISTANT: Dr. Granville Lewis, MD   ANESTHESIA: General Endotracheal   EBL: 200 cc   SPECIMENS: None   DRAINS: None   COMPLICATIONS: None   CONDITION: Hemodynamically stable   HISTORY: Eric [name] is a 71 y.o. y.o. male who initially presented to the outpatient clinic with signs and symptoms consistent with back pain and left lower extremity radiculopathy in the L5 distribution. MRI demonstrated isthmic spondylolisthesis with severe bilateral foraminal stenosis with progressive disc collapse.  X-rays revealed similar findings of anterior listhesis of L5 on S1 with significant degenerative disc disease and Modic endplate changes. Treatment options were discussed including pain control, epidural steroid injections, physical therapy, he failed multiple conservative measures therefore offered him surgical intervention in the form of a minimally invasive left decompression and transforaminal lumbar interbody fusion L5-S1. All risks, benefits and expected outcomes were discussed agreed upon.  Informed  consent was obtained and witnessed.   PROCEDURE IN DETAIL: The patient was brought to the operating room. After induction of general anesthesia, the patient was positioned on the operative table in the prone position on the [place] table. All pressure points were meticulously padded. Skin incision was then marked out and prepped and draped in the usual sterile fashion. Physician driven timeout was performed.   Using biplanar fluoroscopy, the C arm for sterilely draped and brought into the field and the paramedian incisions were planned over the L5-S1 interspace.  Local anesthetic was injected bilaterally.  Using a 10 blade, paramedian incision was created bilaterally.  Using Jamshidi, the bilateral L5 pedicles were accessed under biplanar fluoroscopy.  K wires were placed and the Jamshidi's were removed.  This was repeated bilaterally at S1.  Attention was then turned to docking the Metrix dilator.  Using with a series of dilators, on the patient's left side the facet lamina junction was docked with a 26 x 8 mm tube.  This was confirmed to be in the appropriate trajectory under lateral fluoroscopy.  At this point the microscope was sterilely draped and brought into the field.   Using Bovie electrocautery, soft tissue was cleared from the lamina and facet at L5-S1.  Using a high-speed drill and collecting the autograft, a laminectomy and complete facetectomy was performed down to the ligamentum flavum.  This autograft was saved for later.  The ligamentum flavum was identified to its attachment along the superior lamina and lateral pars.  Using a series of micro curettes, the ligamentum flavum was gently elevated and the epidural space was identified.  The ligamentum flavum was completely resected using Kerrison rongeurs.  The traversing and exiting nerve roots were identified.  Using Kerrison rongeurs, the common dural tube and traversing and exiting nerve roots were completely decompressed from the  ligamentum  flavum.  They appeared pulsatile and noncompressed.  Camden's triangle was identified, the traversing nerve root was gently retracted medially, and the epidural space was coagulated and cleared of epidural veins.  The disc space was identified.  The disc annulus was then coagulated and incised with an 11 blade.  Using Kerrison rongeurs the annulotomy was widened.  Using a high-speed drill, the superior portion of the S1 endplate dorsally had to be drilled down in order to access the disc space.  Using a series of disc prep shavers and curettes a radical discectomy was performed to subchondral bleeding bone at both endplates.  The disc base was then trialed and found to be in 7 mm interbody size.  A mixture of autograft and allograft and BMP was then placed anteriorly and medially and packed with a bone tamp.  Using a nerve root retractor, the traversing nerve root was gently protected during placement of the interbody.   Using lateral fluoroscopy, the interbody was then placed under live fluoroscopy.  Appropriate placement was identified.  The remainder of the autograft and allograft was then placed laterally and the intervertebral space to the interbody device.  Hemostasis was achieved with passive hemostatics and bipolar cautery.  The traversing nerve root, neural tube and exiting nerve root were followed with a ball-tipped probe and noted to be noncompressed, pulsatile and in their normal position.  A few pieces of Gelfoam with thrombin were placed over the thecal sac.  The Metrix tube was then directed laterally, and the remainder of autograft and allograft was placed in the lateral gutter.  The Metrix dilator tube was gently removed and hemostasis was achieved with bipolar cautery and the soft tissues.   The above listed pedicle screws were then placed under lateral fluoroscopy over the K wires bilaterally at L5 and S1.  Appropriate sized rods were measured, contoured and placed percutaneously.  These were  confirmed to be within the tulips, setscrews were placed and final tightened to the manufacturer's recommendations.  Percutaneous towers were removed from the screws.  Final AP and lateral fluoroscopy images confirmed appropriate placement of all hardware.  Hemostasis was achieved with bipolar cautery.  The wound was closed in layers, 2-0 and 3-0 Vicryl sutures for the dermis.  Skin was closed with skin glue.  Sterile dressing was applied.   At the end of the case all sponge, needle, and instrument counts were correct. The patient was then transferred to the stretcher, extubated, and taken to the post-anesthesia care unit in stable hemodynamic condition.

## 2022-07-09 NOTE — Anesthesia Procedure Notes (Signed)
Procedure Name: Intubation Date/Time: 07/09/2022 10:41 AM  Performed by: Anastasio Auerbach, CRNAPre-anesthesia Checklist: Patient identified, Emergency Drugs available, Suction available and Patient being monitored Patient Re-evaluated:Patient Re-evaluated prior to induction Oxygen Delivery Method: Circle system utilized Preoxygenation: Pre-oxygenation with 100% oxygen Induction Type: IV induction Ventilation: Mask ventilation without difficulty Laryngoscope Size: Mac and 3 Grade View: Grade II Tube type: Oral Tube size: 8.0 mm Number of attempts: 1 Airway Equipment and Method: Stylet and Oral airway Placement Confirmation: ETT inserted through vocal cords under direct vision, positive ETCO2 and breath sounds checked- equal and bilateral Secured at: 22 cm Tube secured with: Tape Dental Injury: Teeth and Oropharynx as per pre-operative assessment

## 2022-07-09 NOTE — Transfer of Care (Signed)
Immediate Anesthesia Transfer of Care Note  Patient: Eric [name]  Procedure(s) Performed: MINIMALLY INVASIVE DECOMPRESSION AND TRANSFORAMINAL LUMBAR INTERBODY FUSION LUMBAR FIVE-SACRAL ONE (Left)  Patient Location: PACU  Anesthesia Type:General  Level of Consciousness: awake, alert , and oriented  Airway & Oxygen Therapy: Patient Spontanous Breathing and Patient connected to nasal cannula oxygen  Post-op Assessment: Report given to RN and Post -op Vital signs reviewed and stable  Post vital signs: Reviewed and stable  Last Vitals:  Vitals Value Taken Time  BP 120/71 07/09/22 1415  Temp    Pulse 109 07/09/22 1418  Resp 25 07/09/22 1418  SpO2 95 % 07/09/22 1418  Vitals shown include unvalidated device data.  Last Pain:  Vitals:   07/09/22 0849  TempSrc:   PainSc: 3       Patients Stated Pain Goal: 0 (05/13/84 2778)  Complications: No notable events documented.

## 2022-07-09 NOTE — Anesthesia Postprocedure Evaluation (Signed)
Anesthesia Post Note  Patient: Eric [name]  Procedure(s) Performed: MINIMALLY INVASIVE DECOMPRESSION AND TRANSFORAMINAL LUMBAR INTERBODY FUSION LUMBAR FIVE-SACRAL ONE (Left)     Patient location during evaluation: PACU Anesthesia Type: General Level of consciousness: awake and alert Pain management: pain level controlled Vital Signs Assessment: post-procedure vital signs reviewed and stable Respiratory status: spontaneous breathing, nonlabored ventilation and respiratory function stable Cardiovascular status: blood pressure returned to baseline and stable Postop Assessment: no apparent nausea or vomiting Anesthetic complications: no  No notable events documented.  Last Vitals:  Vitals:   07/09/22 1615 07/09/22 1630  BP: (!) 145/96 (!) 150/92  Pulse: (!) 107 (!) 105  Resp: 14 10  Temp:    SpO2: 95% 94%    Last Pain:  Vitals:   07/09/22 1630  TempSrc:   PainSc: Asleep    LLE Motor Response: Purposeful movement (07/09/22 1630)   RLE Motor Response: Purposeful movement (07/09/22 1630)        Cayden Granholm,W. EDMOND

## 2022-07-10 ENCOUNTER — Encounter (HOSPITAL_COMMUNITY): Payer: Self-pay | Admitting: Neurological Surgery

## 2022-07-10 DIAGNOSIS — M48061 Spinal stenosis, lumbar region without neurogenic claudication: Secondary | ICD-10-CM | POA: Diagnosis not present

## 2022-07-10 DIAGNOSIS — Z7952 Long term (current) use of systemic steroids: Secondary | ICD-10-CM | POA: Diagnosis not present

## 2022-07-10 DIAGNOSIS — M4316 Spondylolisthesis, lumbar region: Secondary | ICD-10-CM | POA: Diagnosis not present

## 2022-07-10 DIAGNOSIS — Z85828 Personal history of other malignant neoplasm of skin: Secondary | ICD-10-CM | POA: Diagnosis not present

## 2022-07-10 DIAGNOSIS — Z87891 Personal history of nicotine dependence: Secondary | ICD-10-CM | POA: Diagnosis not present

## 2022-07-10 DIAGNOSIS — M4726 Other spondylosis with radiculopathy, lumbar region: Secondary | ICD-10-CM | POA: Diagnosis not present

## 2022-07-10 MED ORDER — DOCUSATE SODIUM 100 MG PO CAPS: 100.0000 mg | ORAL_CAPSULE | Freq: Two times a day (BID) | ORAL | 0 refills | Status: DC

## 2022-07-10 MED ORDER — OXYCODONE HCL 10 MG PO TABS: 10.0000 mg | ORAL_TABLET | ORAL | 0 refills | Status: DC | PRN

## 2022-07-10 MED ORDER — ZOLPIDEM TARTRATE ER 6.25 MG PO TBCR: 6.2500 mg | EXTENDED_RELEASE_TABLET | Freq: Every evening | ORAL | 1 refills | Status: DC | PRN

## 2022-07-10 MED ORDER — METHOCARBAMOL 500 MG PO TABS: 500.0000 mg | ORAL_TABLET | Freq: Four times a day (QID) | ORAL | 2 refills | Status: DC | PRN

## 2022-07-10 NOTE — Evaluation (Signed)
Occupational Therapy Evaluation and Discharge Patient Details Name: Eric [name] MRN: [id number] DOB: 1950-11-20 Today's Date: 07/10/2022   History of Present Illness Pt is a 46 you male s/p minimally invasive L 5-S1 decompression and transforaminal lumbar interbody arthrodesis.   Clinical Impression   This 71 yo male admitted and underwent above presents to acute OT with all education completed as well as post op back handout provided. No further OT needs, we will D/C from acute.      Recommendations for follow up therapy are one component of a multi-disciplinary discharge planning process, led by the attending physician.  Recommendations may be updated based on patient status, additional functional criteria and insurance authorization.   Follow Up Recommendations  No OT follow up     Assistance Recommended at Discharge PRN  Patient can return home with the following Assistance with cooking/housework;Assist for transportation    Functional Status Assessment  Patient has had a recent decline in their functional status and demonstrates the ability to make significant improvements in function in a reasonable and predictable amount of time. (without further need for OT)  Equipment Recommendations  Other (comment) (RW)       Precautions / Restrictions Precautions Precautions: Back Precaution Booklet Issued: Yes (comment) Precaution Comments: no brace required per orders Restrictions Weight Bearing Restrictions: No      Mobility Bed Mobility Overal bed mobility: Modified Independent             General bed mobility comments: VCs for technique, did it perfectly    Transfers Overall transfer level: Modified independent Equipment used: Rolling walker (2 wheels)               General transfer comment: increased time, technique      Balance Overall balance assessment: Mild deficits observed, not formally tested                                          ADL either performed or assessed with clinical judgement   ADL Overall ADL's : Modified independent                                       General ADL Comments: Educated on use of 2 cups for brushing teeth, using wet wipes for back peri care, sit<>stand stance that is better for keeping back straight, use of pillows when in bed, sitting for no more than 20-30 minutes at a time building up to 1 hour, lower body dressing position.     Vision Patient Visual Report: No change from baseline              Pertinent Vitals/Pain Pain Assessment Pain Assessment: 0-10 Pain Score: 7  Pain Location: right lower back/hip Pain Descriptors / Indicators: Aching, Grimacing, Shooting, Sore Pain Intervention(s): Limited activity within patient's tolerance, Monitored during session, Premedicated before session, Repositioned     Hand Dominance Right   Extremity/Trunk Assessment Upper Extremity Assessment Upper Extremity Assessment: Overall WFL for tasks assessed           Communication Communication Communication: No difficulties   Cognition Arousal/Alertness: Awake/alert Behavior During Therapy: WFL for tasks assessed/performed Overall Cognitive Status: Within Functional Limits for tasks assessed  General Comments  feels better with usinf RW when up on his [place] expects to be discharged to:: Private residence Living Arrangements: Other relatives;Non-relatives/Friends Available Help at Discharge: Family;Available PRN/intermittently Type of Home: House Home Access: Stairs to enter [employer] of Steps: 2 Entrance Stairs-Rails: None Home Layout: One level     Bathroom Shower/Tub: Walk-in shower;Door   [employer] Toilet: Standard Bathroom Accessibility: No   Home Equipment: None          Prior Functioning/Environment Prior Level of Function :  Independent/Modified Independent;Driving                        OT Problem List: Decreased range of motion;Impaired balance (sitting and/or standing);Pain         OT Goals(Current goals can be found in the care plan section) Acute Rehab OT Goals Patient Stated Goal: to go home today         AM-PAC OT "6 Clicks" Daily Activity     Outcome Measure Help from another person eating meals?: None Help from another person taking care of personal grooming?: None Help from another person toileting, which includes using toliet, bedpan, or urinal?: None Help from another person bathing (including washing, rinsing, drying)?: None Help from another person to put on and taking off regular upper body clothing?: None Help from another person to put on and taking off regular lower body clothing?: None 6 Click Score: 24   End of Session Equipment Utilized During Treatment: Rolling walker (2 wheels) Nurse Communication:  (no further OT needs)  Activity Tolerance: Patient tolerated treatment well Patient left: in chair;with call bell/phone within reach  OT Visit Diagnosis: Unsteadiness on feet (R26.81);Other abnormalities of gait and mobility (R26.89);Pain Pain - Right/Left: Right Pain - part of body:  (lower back/hip)                Time: 2952-8413 OT Time Calculation (min): 21 min Charges:  OT General Charges $OT Visit: 1 Visit OT Evaluation $OT Eval Moderate Complexity: [place], OTR/L Acute [employer] [facility] [phone] Office [phone]    Almon Register 07/10/2022, 9:17 AM

## 2022-07-10 NOTE — Discharge Summary (Signed)
  Physician Discharge Summary  Patient ID: Eric [name] MRN: [id number] DOB/AGE: [birth year] 69 y.o.  Admit date: 07/09/2022 Discharge date: 07/10/2022  Admission Diagnoses:  Isthmic spondylolisthesis L5-S1 with LLE radiculopathy  Discharge Diagnoses:  Same Principal Problem:   Lumbar foraminal stenosis   Discharged Condition: Stable  Hospital Course:  Eric [name] is a 71 y.o. male that underwent an elective L5-S1 decompression instrumentation and interbody fusion.  He tolerated the surgery well.  Postoperatively he was monitored overnight, with an uncomplicated course.  He was ambulating independently.  His pain was controlled on oral medication, he was having normal bowel bladder function.  He was tolerating a normal diet.  His preoperative radiculopathy was improved.  Treatments: Surgery -L5-S1 minimally invasive decompression, transforaminal lumbar interbody fusion  Discharge Exam: Blood pressure (!) 141/76, pulse 100, temperature 98.4 F (36.9 C), temperature source Oral, resp. rate 18, height '5\' 10"'$  (1.778 m), weight 95.3 kg, SpO2 98 %. Awake, alert, oriented x 3 Cranial nerves II through XII intact Speech fluent, appropriate CN grossly intact 5/5 BUE/BLE Wound c/d/i  Disposition: Discharge disposition: 01-Home or Self Care       Discharge Instructions     Incentive spirometry RT   Complete by: As directed       Allergies as of 07/10/2022       Reactions   Ciprofloxacin Diarrhea   REACTION: GI upset, C-diff   Metronidazole Hives   Flagyl [metronidazole Hcl] Rash        Medication List     STOP taking these medications    naproxen sodium 220 MG tablet Commonly known as: ALEVE       TAKE these medications    cetirizine 10 MG tablet Commonly known as: ZYRTEC Take 10 mg by mouth daily.   docusate sodium 100 MG capsule Commonly known as: COLACE Take 1 capsule (100 mg total) by mouth 2 (two) times daily.   FIBER PO Take 1 tablet  by mouth daily.   fluticasone 50 MCG/ACT nasal spray Commonly known as: FLONASE Place 2 sprays into both nostrils daily.   methocarbamol 500 MG tablet Commonly known as: ROBAXIN Take 1 tablet (500 mg total) by mouth every 6 (six) hours as needed for muscle spasms.   multivitamin with minerals Tabs tablet Take 1 tablet by mouth daily.   Oxycodone HCl 10 MG Tabs Take 1 tablet (10 mg total) by mouth every 4 (four) hours as needed for severe pain ((score 7 to 10)).   tadalafil 20 MG tablet Commonly known as: CIALIS TAKE 1 TABLET BY MOUTH EVERY OTHER DAY AS NEEDED FOR ERECTILE DYSFUNCTION   zolpidem 6.25 MG CR tablet Commonly known as: Ambien CR Take 1 tablet (6.25 mg total) by mouth at bedtime as needed for sleep.        Follow-up Information     Vivan Agostino C, DO Follow up in 3 week(s).   Contact information: [address] [place] [address] [phone]                 Signed: Theodoro Doing Kmya Placide 07/10/2022, 9:36 AM

## 2022-07-10 NOTE — Plan of Care (Signed)
  Problem: Education: Goal: Ability to verbalize activity precautions or restrictions will improve Outcome: Completed/Met Goal: Knowledge of the prescribed therapeutic regimen will improve Outcome: Completed/Met Goal: Understanding of discharge needs will improve Outcome: Completed/Met   Problem: Activity: Goal: Ability to avoid complications of mobility impairment will improve Outcome: Completed/Met Goal: Ability to tolerate increased activity will improve Outcome: Completed/Met Goal: Will remain free from falls Outcome: Completed/Met   Problem: Bowel/Gastric: Goal: Gastrointestinal status for postoperative course will improve Outcome: Completed/Met  Patient alert and oriented, void, ambulate, surgical site clean and dry. D/c instructions explain and given to the patient all questions answered. Pt. D/c home per order.

## 2022-07-10 NOTE — Evaluation (Signed)
Physical Therapy Evaluation Patient Details Name: Eric [name] MRN: [id number] DOB: 1950-09-15 Today's Date: 07/10/2022  History of Present Illness  Pt is a 9 you male s/p minimally invasive L 5-S1 decompression and transforaminal lumbar interbody arthrodesis.  Clinical Impression  Patient is s/p above surgery resulting in the deficits listed below (see PT Problem List). All education completed. I have answered all patient's question regarding PT and mobility.    I have encouraged the patient to gradually increase activity daily to tolerance.   Pt feels ready for DC home today.   No further PT needs.  Will need RW for home use.          Recommendations for follow up therapy are one component of a multi-disciplinary discharge planning process, led by the attending physician.  Recommendations may be updated based on patient status, additional functional criteria and insurance authorization.  Follow Up Recommendations No PT follow up      Assistance Recommended at Discharge Intermittent Supervision/Assistance  Patient can return home with the following       Equipment Recommendations Rolling walker (2 wheels)  Recommendations for Other Services       Functional Status Assessment Patient has had a recent decline in their functional status and demonstrates the ability to make significant improvements in function in a reasonable and predictable amount of time.     Precautions / Restrictions Precautions Precautions: Back Precaution Booklet Issued: Yes (comment) Precaution Comments: no brace required per orders Restrictions Weight Bearing Restrictions: No      Mobility  Bed Mobility Overal bed mobility: Modified Independent             General bed mobility comments:  (Performed log rolling technique, went sit to side to supine)    Transfers Overall transfer level: Modified independent Equipment used: Rolling walker (2 wheels)               General transfer  comment: Cues for safest technique    Ambulation/Gait Ambulation/Gait assistance: Modified independent (Device/Increase time) Gait Distance (Feet): 120 Feet Assistive device: Rolling walker (2 wheels) Gait Pattern/deviations: Step-through pattern Gait velocity: below normal walking speed, but not formally measured     General Gait Details: Took standing rest break. Had several episodes of shooting pain in legs with gait but able to maintain upright independantly with RW  Stairs Stairs:  (Verbally discussed stairs and technique, Pt did not ffeel need to practice)          Wheelchair Mobility    Modified [place] (Stroke Patients Only)       Balance                                             Pertinent Vitals/Pain Pain Assessment Pain Assessment: 0-10 Pain Score: 6  Pain Location: right lower back/hip Pain Descriptors / Indicators: Aching, Shooting Pain Intervention(s): Limited activity within patient's tolerance, Monitored during session, Patient requesting pain meds-RN notified    Home Living Family/patient expects to be discharged to:: Private residence Living Arrangements: Other relatives;Non-relatives/Friends Available Help at Discharge: Family;Available PRN/intermittently Type of Home: House Home Access: Stairs to enter Entrance Stairs-Rails: None (Can fit walker on each step) Entrance Stairs-Number of Steps: 2   Home Layout: One level Home Equipment: None      Prior Function Prior Level of Function : Independent/Modified Independent;Driving  Mobility Comments: plays pickleball and walks regularly       Hand Dominance   Dominant Hand: Right    Extremity/Trunk Assessment   Upper Extremity Assessment Upper Extremity Assessment: Overall WFL for tasks assessed    Lower Extremity Assessment Lower Extremity Assessment: Generalized weakness (weakness due to low back pain)    Cervical / Trunk Assessment Cervical /  Trunk Assessment: Back Surgery  Communication   Communication: No difficulties  Cognition                                                General Comments General comments (skin integrity, edema, etc.): No family/visitors present during session (Simultaneous filing. User may not have seen previous data.)    Exercises     Assessment/Plan    PT Assessment Patient does not need any further PT services  PT Problem List         PT Treatment Interventions      PT Goals (Current goals can be found in the Care Plan section)  Acute Rehab PT Goals Patient Stated Goal: to return to pickleball    Frequency       Co-evaluation               AM-PAC PT "6 Clicks" Mobility  Outcome Measure Help needed turning from your back to your side while in a flat bed without using bedrails?: None Help needed moving from lying on your back to sitting on the side of a flat bed without using bedrails?: None Help needed moving to and from a bed to a chair (including a wheelchair)?: None Help needed standing up from a chair using your arms (e.g., wheelchair or bedside chair)?: None Help needed to walk in hospital room?: None Help needed climbing 3-5 steps with a railing? : A Little 6 Click Score: 23    End of Session Equipment Utilized During Treatment: Gait belt Activity Tolerance: Patient tolerated treatment well Patient left: in bed;with call bell/phone within reach Nurse Communication: Patient requests pain meds PT Visit Diagnosis: Unsteadiness on feet (R26.81)    Time: 6812-7517 PT Time Calculation (min) (ACUTE ONLY): 16 min   Charges:   PT Evaluation $PT Eval Low Complexity: [place], PT   Acute Rehabilitation Services  Office [phone] 07/10/2022   Melvern Banker 07/10/2022, 9:25 AM

## 2022-07-13 ENCOUNTER — Encounter (HOSPITAL_COMMUNITY): Payer: Self-pay | Admitting: Neurological Surgery

## 2022-07-27 DIAGNOSIS — M5416 Radiculopathy, lumbar region: Secondary | ICD-10-CM | POA: Diagnosis not present

## 2022-09-09 DIAGNOSIS — M4317 Spondylolisthesis, lumbosacral region: Secondary | ICD-10-CM | POA: Diagnosis not present

## 2022-09-09 DIAGNOSIS — M5416 Radiculopathy, lumbar region: Secondary | ICD-10-CM | POA: Diagnosis not present

## 2022-11-23 DIAGNOSIS — H2511 Age-related nuclear cataract, right eye: Secondary | ICD-10-CM | POA: Diagnosis not present

## 2022-11-23 DIAGNOSIS — H2512 Age-related nuclear cataract, left eye: Secondary | ICD-10-CM | POA: Diagnosis not present

## 2022-12-02 DIAGNOSIS — H2511 Age-related nuclear cataract, right eye: Secondary | ICD-10-CM | POA: Diagnosis not present

## 2022-12-07 ENCOUNTER — Encounter: Payer: Self-pay | Admitting: Family Medicine

## 2022-12-08 MED ORDER — TADALAFIL 20 MG PO TABS: ORAL_TABLET | ORAL | 1 refills | Status: DC

## 2022-12-09 DIAGNOSIS — H2512 Age-related nuclear cataract, left eye: Secondary | ICD-10-CM | POA: Diagnosis not present

## 2022-12-09 DIAGNOSIS — H269 Unspecified cataract: Secondary | ICD-10-CM | POA: Diagnosis not present

## 2023-01-06 DIAGNOSIS — M4317 Spondylolisthesis, lumbosacral region: Secondary | ICD-10-CM | POA: Diagnosis not present

## 2023-01-06 DIAGNOSIS — Z6830 Body mass index (BMI) 30.0-30.9, adult: Secondary | ICD-10-CM | POA: Diagnosis not present

## 2023-02-14 DIAGNOSIS — M129 Arthropathy, unspecified: Secondary | ICD-10-CM | POA: Diagnosis not present

## 2023-02-14 DIAGNOSIS — M79672 Pain in left foot: Secondary | ICD-10-CM | POA: Diagnosis not present

## 2023-02-14 DIAGNOSIS — M109 Gout, unspecified: Secondary | ICD-10-CM | POA: Diagnosis not present

## 2023-02-14 DIAGNOSIS — E6609 Other obesity due to excess calories: Secondary | ICD-10-CM | POA: Diagnosis not present

## 2023-02-14 DIAGNOSIS — Z79899 Other long term (current) drug therapy: Secondary | ICD-10-CM | POA: Diagnosis not present

## 2023-02-14 DIAGNOSIS — R030 Elevated blood-pressure reading, without diagnosis of hypertension: Secondary | ICD-10-CM | POA: Diagnosis not present

## 2023-02-14 DIAGNOSIS — Z6832 Body mass index (BMI) 32.0-32.9, adult: Secondary | ICD-10-CM | POA: Diagnosis not present

## 2023-02-22 DIAGNOSIS — R059 Cough, unspecified: Secondary | ICD-10-CM | POA: Diagnosis not present

## 2023-02-22 DIAGNOSIS — E6609 Other obesity due to excess calories: Secondary | ICD-10-CM | POA: Diagnosis not present

## 2023-02-22 DIAGNOSIS — Z6831 Body mass index (BMI) 31.0-31.9, adult: Secondary | ICD-10-CM | POA: Diagnosis not present

## 2023-02-22 DIAGNOSIS — M21612 Bunion of left foot: Secondary | ICD-10-CM | POA: Diagnosis not present

## 2023-02-22 DIAGNOSIS — J018 Other acute sinusitis: Secondary | ICD-10-CM | POA: Diagnosis not present

## 2023-02-22 DIAGNOSIS — M109 Gout, unspecified: Secondary | ICD-10-CM | POA: Diagnosis not present

## 2023-03-04 DIAGNOSIS — M79672 Pain in left foot: Secondary | ICD-10-CM | POA: Diagnosis not present

## 2023-03-04 DIAGNOSIS — M7752 Other enthesopathy of left foot: Secondary | ICD-10-CM | POA: Diagnosis not present

## 2023-03-04 DIAGNOSIS — M21612 Bunion of left foot: Secondary | ICD-10-CM | POA: Diagnosis not present

## 2023-03-04 DIAGNOSIS — M722 Plantar fascial fibromatosis: Secondary | ICD-10-CM | POA: Diagnosis not present

## 2023-05-01 DIAGNOSIS — Z23 Encounter for immunization: Secondary | ICD-10-CM | POA: Diagnosis not present

## 2023-05-04 ENCOUNTER — Other Ambulatory Visit (INDEPENDENT_AMBULATORY_CARE_PROVIDER_SITE_OTHER): Payer: Medicare Other

## 2023-05-04 ENCOUNTER — Ambulatory Visit: Payer: Medicare Other | Admitting: Family Medicine

## 2023-05-04 VITALS — BP 150/90 | HR 90 | Temp 97.7°F | Ht 70.0 in | Wt 214.9 lb

## 2023-05-04 DIAGNOSIS — L218 Other seborrheic dermatitis: Secondary | ICD-10-CM | POA: Diagnosis not present

## 2023-05-04 DIAGNOSIS — L814 Other melanin hyperpigmentation: Secondary | ICD-10-CM | POA: Diagnosis not present

## 2023-05-04 DIAGNOSIS — R5383 Other fatigue: Secondary | ICD-10-CM

## 2023-05-04 DIAGNOSIS — L570 Actinic keratosis: Secondary | ICD-10-CM | POA: Diagnosis not present

## 2023-05-04 DIAGNOSIS — R4189 Other symptoms and signs involving cognitive functions and awareness: Secondary | ICD-10-CM

## 2023-05-04 DIAGNOSIS — D692 Other nonthrombocytopenic purpura: Secondary | ICD-10-CM | POA: Diagnosis not present

## 2023-05-04 DIAGNOSIS — R531 Weakness: Secondary | ICD-10-CM | POA: Diagnosis not present

## 2023-05-04 DIAGNOSIS — M791 Myalgia, unspecified site: Secondary | ICD-10-CM

## 2023-05-04 DIAGNOSIS — R059 Cough, unspecified: Secondary | ICD-10-CM | POA: Diagnosis not present

## 2023-05-04 DIAGNOSIS — L853 Xerosis cutis: Secondary | ICD-10-CM | POA: Diagnosis not present

## 2023-05-04 LAB — COMPREHENSIVE METABOLIC PANEL
ALT: 18 U/L (ref 0–53)
AST: 24 U/L (ref 0–37)
Albumin: 4.4 g/dL (ref 3.5–5.2)
Alkaline Phosphatase: 80 U/L (ref 39–117)
BUN: 12 mg/dL (ref 6–23)
CO2: 27 meq/L (ref 19–32)
Calcium: 9.6 mg/dL (ref 8.4–10.5)
Chloride: 102 meq/L (ref 96–112)
Creatinine, Ser: 0.81 mg/dL (ref 0.40–1.50)
GFR: 89.49 mL/min (ref >60.00)
Glucose, Bld: 119 mg/dL — ABNORMAL HIGH (ref 70–99)
Potassium: 4.1 meq/L (ref 3.5–5.1)
Sodium: 140 meq/L (ref 135–145)
Total Bilirubin: 1.2 mg/dL (ref 0.2–1.2)
Total Protein: 7.9 g/dL (ref 6.0–8.3)

## 2023-05-04 LAB — CBC WITH DIFFERENTIAL/PLATELET
Basophils Absolute: 0 10*3/uL (ref 0.0–0.1)
Basophils Relative: 0.6 % (ref 0.0–3.0)
Eosinophils Absolute: 0.2 10*3/uL (ref 0.0–0.7)
Eosinophils Relative: 2.6 % (ref 0.0–5.0)
HCT: 43.4 % (ref 39.0–52.0)
Hemoglobin: 14.5 g/dL (ref 13.0–17.0)
Lymphocytes Relative: 17 % (ref 12.0–46.0)
Lymphs Abs: 1 10*3/uL (ref 0.7–4.0)
MCHC: 33.3 g/dL (ref 30.0–36.0)
MCV: 95.3 fL (ref 78.0–100.0)
Monocytes Absolute: 0.7 10*3/uL (ref 0.1–1.0)
Monocytes Relative: 12.2 % — ABNORMAL HIGH (ref 3.0–12.0)
Neutro Abs: 3.9 10*3/uL (ref 1.4–7.7)
Neutrophils Relative %: 67.6 % (ref 43.0–77.0)
Platelets: 225 10*3/uL (ref 150.0–400.0)
RBC: 4.56 Mil/uL (ref 4.22–5.81)
RDW: 13.2 % (ref 11.5–15.5)
WBC: 5.8 10*3/uL (ref 4.0–10.5)

## 2023-05-04 LAB — SEDIMENTATION RATE: Sed Rate: 11 mm/h (ref 0–20)

## 2023-05-04 LAB — TSH: TSH: 0.86 u[IU]/mL (ref 0.35–5.50)

## 2023-05-04 LAB — C-REACTIVE PROTEIN: CRP: <1 mg/dL (ref 0.5–20.0)

## 2023-05-04 NOTE — Progress Notes (Signed)
Established Patient Office Visit  Subjective   Patient ID: Eric [name], male    DOB: [birth year]  Age: 72 y.o. MRN: [id number]  Chief Complaint  Patient presents with   Cough    Patient complains of cough, x3 months, Productive with greenish sputum    Memory Loss   Shortness of Breath    HPI   Eric [name] has past medical history significant for lumbar stenosis, history of depression, history of colon polyps.  Seen today for multiple issues as follows  Developed a cough over the summer.  This was back in July.  He had some shortness of breath with activities and was seen at another medical clinic and diagnosed with reactive airway issue with bronchitis.  He was treated with prednisone and some sort of antibiotic and did improve.  Had chest x-ray at that time reportedly normal.  Overall improved but has still had some occasional cough at times.  Denies any ACE inhibitor use, history of asthma, GERD symptoms, or postnasal drip symptoms.  He states he even had pulmonary function test when he was seen at [facility] medical clinic over the summer reportedly normal.  Smoked years ago but quit over 20 years ago.  He complains of occasional weakness thighs bilaterally.  No low back pain.  Had spinal fusion surgery last December.  No paresthesias.  No urine or stool incontinence.  Achy sensation thighs at times.  No focal weakness.  Denies any claudication symptoms.  Plays pickle ball regularly.  No statin use.  Has some generalized fatigue.  He complains of "brain fog ".  He states he frequently goes to look up things and forgot what he was in the process of looking up.  Sometimes seems to lose his train of thought.  Concerned because his father was diagnosed dementia age 2.  He has a brother with Parkinson's disease.  Past Medical History:  Diagnosis Date   Anal fissure    Chicken pox    DEPRESSION 12/25/2008   Dyslipidemia    LIBIDO, DECREASED 12/25/2008   SKIN CANCER, HX OF 12/25/2008   Past  Surgical History:  Procedure Laterality Date   FRACTURE SURGERY Right 04/2009   right fibula fracture-plate and 6 screws   KNEE ARTHROSCOPY  1994, 1996   right, left   NASAL POLYP SURGERY  2004, 2009, 2012   x 4   SHOULDER ARTHROSCOPY W/ ROTATOR CUFF REPAIR  07/21/2003   TONSILLECTOMY     TRANSFORAMINAL LUMBAR INTERBODY FUSION W/ MIS 1 LEVEL Left 07/09/2022   Procedure: MINIMALLY INVASIVE DECOMPRESSION AND TRANSFORAMINAL LUMBAR INTERBODY FUSION LUMBAR FIVE-SACRAL ONE;  Surgeon: Bethann Goo, DO;  Location: [facility] OR;  Service: Neurosurgery;  Laterality: Left;  3C    reports that he quit smoking about 16 years ago. His smoking use included cigarettes. He has never used smokeless tobacco. He reports current alcohol use. He reports that he does not use drugs. family history includes Alzheimer's disease (age of onset: 44) in his father; Cancer in his father; Multiple sclerosis in his brother; Parkinson's disease in his brother. Allergies  Allergen Reactions   Ciprofloxacin Diarrhea    REACTION: GI upset, C-diff   Metronidazole Hives   Flagyl [Metronidazole Hcl] Rash    Review of Systems  Constitutional:  Positive for malaise/fatigue. Negative for chills, fever and weight loss.  HENT:  Negative for sinus pain.   Respiratory:  Positive for cough.   Cardiovascular:  Negative for chest pain.  Gastrointestinal:  Negative for abdominal pain.  Musculoskeletal:  Negative for back pain.  Neurological:  Positive for weakness. Negative for dizziness and headaches.      Objective:     BP (!) 150/90 (BP Location: Left Arm, Cuff Size: Normal)   Pulse 90   Temp 97.7 F (36.5 C) (Oral)   Ht 5\' 10"  (1.778 m)   Wt 214 lb 14.4 oz (97.5 kg)   SpO2 99%   BMI 30.83 kg/m  BP Readings from Last 3 Encounters:  05/04/23 (!) 150/90  07/10/22 (!) 141/76  07/03/22 (!) 164/94   Wt Readings from Last 3 Encounters:  05/04/23 214 lb 14.4 oz (97.5 kg)  07/09/22 210 lb (95.3 kg)  07/03/22 214 lb (97.1  kg)      Physical Exam Vitals reviewed.  Constitutional:      General: He is not in acute distress.    Appearance: He is well-developed. He is not ill-appearing.  Neck:     Thyroid: No thyromegaly.  Cardiovascular:     Rate and Rhythm: Normal rate and regular rhythm.  Pulmonary:     Effort: Pulmonary effort is normal.     Breath sounds: Normal breath sounds. No decreased breath sounds, wheezing or rales.  Musculoskeletal:     Cervical back: Neck supple.     Right lower leg: No edema.     Left lower leg: No edema.  Lymphadenopathy:     Cervical: No cervical adenopathy.  Neurological:     General: No focal deficit present.     Mental Status: He is alert.     Comments: Deep tendon reflexes are 2+ ankle and knee bilaterally.  He has full strength with plantarflexion, dorsiflexion, knee extension, and hip flexion bilaterally      No results found for any visits on 05/04/23.    The ASCVD Risk score (Arnett DK, et al., 2019) failed to calculate for the following reasons:   Cannot find a previous HDL lab   Cannot find a previous total cholesterol lab    Assessment & Plan:   #1 intermittent cough.  Patient relates severe bronchial infection back in July.  Improved initially following prednisone.  Does not have any wheezing at this time.  Chest x-ray at that time was unremarkable.  Does not have any red flags such as appetite change, weight loss, hemoptysis, etc.  No ACE inhibitor use.  Sounds like his symptoms overall are improved compared with couple months ago.  If symptoms not resolving next month consider follow-up x-rays  #2 bilateral myalgias thighs.  Also question subjectively of some weakness though he seems to have excellent strength on exam today.  No statin use.  Also complaining of some generalized fatigue.  Check further labs with CBC, CMP, TSH, sed rate, C-reactive protein  #3 concerns for "brain fog ".  Patient has family history of dementia as above.  Oriented to  time person place.  Oriented to day of week, year, date.  3 out of 3 3 word recall.  No difficulty with serial subtractions.  We did not see evidence for significant cognitive decline at this time but did discuss possible formal neurocognitive testing with neuropsychologist if he has any ongoing concerns.   No follow-ups on file.    Evelena Peat, MD

## 2023-05-04 NOTE — Patient Instructions (Addendum)
Monitor blood pressure at home and be in touch if consistently > 140/90.    Go for labs at [facility] [address]

## 2023-05-13 ENCOUNTER — Ambulatory Visit: Payer: Medicare Other | Admitting: Family Medicine

## 2023-05-13 DIAGNOSIS — Z0000 Encounter for general adult medical examination without abnormal findings: Secondary | ICD-10-CM

## 2023-05-13 NOTE — Progress Notes (Signed)
 Patient unable to obtain vital signs due to telehealth visit

## 2023-05-13 NOTE — Progress Notes (Signed)
PATIENT CHECK-IN and HEALTH RISK ASSESSMENT QUESTIONNAIRE:  -completed by phone/video for upcoming Medicare Preventive Visit  Pre-Visit Check-in: 1)Vitals (height, wt, BP, etc) - record in vitals section for visit on day of visit Request home vitals (wt, BP, etc.) and enter into vitals, THEN update Vital Signs SmartPhrase below at the top of the HPI. See below.  2)Review and Update Medications, Allergies PMH, Surgeries, Social history in Epic 3)Hospitalizations in the last year with date/reason? Yes spinal fusion 12/21   4)Review and Update Care Team (patient's specialists) in Epic 5) Complete PHQ9 in Epic  6) Complete Fall Screening in Epic 7)Review all Health Maintenance Due and order under PCP if not done.  Medicare Wellness Patient Questionnaire:  Answer theses question about your habits: Do you drink alcohol?  Occasional   Have you ever smoked?yes  Quit date if applicable? 20 years ago  How many packs a day do/did you smoke? 1/2 pack  Do you use smokeless tobacco?No Do you use an illicit drugs? No Do you exercises? Yes IF so, what type and how many days/minutes per week? 6 days, 2 hours, Pickleball, wt building 1x per week Are you sexually active?  Yes Number of partners? 1  Typical breakfast: Varies  Typical lunch:  Grilled chicken and vegetables Typical dinner: Grilled chicken and Vegetables Typical snacks: N/A - avoids cake, soda, junk food  Beverages: Water  Answer theses question about you: Can you perform most household chores? Yes  Do you find it hard to follow a conversation in a noisy room? Yes Do you often ask people to speak up or repeat themselves?Yes Do you feel that you have a problem with memory? Not sure  Do you balance your checkbook and or bank acounts? Yes Do you feel safe at home? Yes  Last dentist visit? 6 months Do you need assistance with any of the following: Please note if so No  Driving?  Feeding yourself?  Getting from bed to chair?  Getting  to the toilet?  Bathing or showering?  Dressing yourself?  Managing money?  Climbing a flight of stairs  Preparing meals?    Do you have Advanced Directives in place (Living Will, Healthcare Power or Attorney)?  Yes    Last eye Exam and location? May 2024, [facility] ophthalmology    Do you currently use prescribed or non-prescribed narcotic or opioid pain medications? No  Do you have a history or close family history of breast, ovarian, tubal or peritoneal cancer or a family member with BRCA (breast cancer susceptibility 1 and 2) gene mutations? No   Nurse/Assistant Credentials/time stamp: MG 3:49 PM   ----------------------------------------------------------------------------------------------------------------------------------------------------------------------------------------------------------------------  Because this visit was a virtual/telehealth visit, some criteria may be missing or patient reported. Any vitals not documented were not able to be obtained and vitals that have been documented are patient reported.    MEDICARE ANNUAL PREVENTIVE CARE VISIT WITH PROVIDER (Welcome to Medicare, initial annual wellness or annual wellness exam)  Virtual Visit via Video Note  I connected with Eric [name] on 05/13/23  by a video enabled telemedicine application and verified that I am speaking with the correct person using two identifiers.  Location patient: home Location provider:work or home office Persons participating in the virtual visit: patient, provider  Concerns and/or follow up today: doing much better now - thinks was long covid. Now doing better.    See HM section in Epic for other details of completed HM.    ROS: negative for report of fevers, unintentional weight  loss, vision changes, vision loss, hearing loss or change, chest pain, sob, hemoptysis, melena, hematochezia, hematuria, falls, bleeding or bruising, thoughts of suicide or self harm, memory  loss  Patient-completed extensive health risk assessment - reviewed and discussed with the patient: See Health Risk Assessment completed with patient prior to the visit either above or in recent phone note. This was reviewed in detailed with the patient today and appropriate recommendations, orders and referrals were placed as needed per Summary below and patient instructions.   Review of Medical History: -PMH, PSH, Family History and current specialty and care providers reviewed and updated and listed below   Patient Care Team: Kristian Covey, MD as PCP - General [place], Peter M, MD as PCP - Cardiology (Cardiology)   Past Medical History:  Diagnosis Date   Anal fissure    Chicken pox    DEPRESSION 12/25/2008   Dyslipidemia    LIBIDO, DECREASED 12/25/2008   SKIN CANCER, HX OF 12/25/2008    Past Surgical History:  Procedure Laterality Date   FRACTURE SURGERY Right 04/2009   right fibula fracture-plate and 6 screws   KNEE ARTHROSCOPY  1994, 1996   right, left   NASAL POLYP SURGERY  2004, 2009, 2012   x 4   SHOULDER ARTHROSCOPY W/ ROTATOR CUFF REPAIR  07/21/2003   TONSILLECTOMY     TRANSFORAMINAL LUMBAR INTERBODY FUSION W/ MIS 1 LEVEL Left 07/09/2022   Procedure: MINIMALLY INVASIVE DECOMPRESSION AND TRANSFORAMINAL LUMBAR INTERBODY FUSION LUMBAR FIVE-SACRAL ONE;  Surgeon: Bethann Goo, DO;  Location: [facility] OR;  Service: Neurosurgery;  Laterality: Left;  3C    Social History   Socioeconomic History   Marital status: Divorced    Spouse name: Not on file   Number of children: 3   Years of education: Not on file   Highest education level: Master's degree (e.g., MA, MS, MEng, MEd, MSW, MBA)  Occupational History   Occupation: Consulting civil engineer: [employer]  Tobacco Use   Smoking status: Former    Current packs/day: 0.00    Types: Cigarettes    Quit date: 11/20/2006    Years since quitting: 16.4   Smokeless tobacco: Never  Vaping Use   Vaping status: Never  Used  Substance and Sexual Activity   Alcohol use: Yes    Comment: occasional   Drug use: No   Sexual activity: Not on file  Other Topics Concern   Not on file  Social History Narrative   Not on file   Social Determinants of Health   Financial Resource Strain: Low Risk  (05/03/2023)   Overall Financial Resource Strain (CARDIA)    Difficulty of Paying Living Expenses: Not hard at all  Food Insecurity: No Food Insecurity (05/03/2023)   Hunger Vital Sign    Worried About Running Out of Food in the Last Year: Never true    Ran Out of Food in the Last Year: Never true  Transportation Needs: No Transportation Needs (05/03/2023)   PRAPARE - Administrator, Civil Service (Medical): No    Lack of Transportation (Non-Medical): No  Physical Activity: Sufficiently Active (05/03/2023)   Exercise Vital Sign    Days of Exercise per Week: 6 days    Minutes of Exercise per Session: 120 min  Stress: Stress Concern Present (05/03/2023)   [employer] of Occupational Health - Occupational Stress Questionnaire    Feeling of Stress : To some extent  Social Connections: Moderately Integrated (05/03/2023)   Social  Connection and Isolation Panel [NHANES]    Frequency of Communication with Friends and Family: More than three times a week    Frequency of Social Gatherings with Friends and Family: More than three times a week    Attends Religious Services: 1 to 4 times per year    Active Member of [employer] or Organizations: Yes    Attends Engineer, structural: More than 4 times per year    Marital Status: Divorced  Intimate Partner Violence: Not At Risk (05/06/2022)   Humiliation, Afraid, Rape, and Kick questionnaire    Fear of Current or Ex-Partner: No    Emotionally Abused: No    Physically Abused: No    Sexually Abused: No    Family History  Problem Relation Age of Onset   Alzheimer's disease Father 12   Cancer Father        melanoma   Multiple sclerosis Brother     Parkinson's disease Brother     Current Outpatient Medications on File Prior to Visit  Medication Sig Dispense Refill   cetirizine (ZYRTEC) 10 MG tablet Take 10 mg by mouth daily.     Multiple Vitamin (MULTIVITAMIN WITH MINERALS) TABS tablet Take 1 tablet by mouth daily.     tadalafil (CIALIS) 20 MG tablet TAKE 1 TABLET BY MOUTH EVERY OTHER DAY AS NEEDED FOR ERECTILE DYSFUNCTION 30 tablet 1   No current facility-administered medications on file prior to visit.    Allergies  Allergen Reactions   Ciprofloxacin Diarrhea    REACTION: GI upset, C-diff   Metronidazole Hives   Flagyl [Metronidazole Hcl] Rash       Physical Exam Vitals requested from patient and listed below if patient had equipment and was able to obtain at home for this virtual visit: There were no vitals filed for this visit. Estimated body mass index is 30.83 kg/m as calculated from the following:   Height as of 05/04/23: 5\' 10"  (1.778 m).   Weight as of 05/04/23: 214 lb 14.4 oz (97.5 kg).  EKG (optional): deferred due to virtual visit  GENERAL: alert, oriented, no acute distress detected; full vision exam deferred due to pandemic and/or virtual encounter   HEENT: atraumatic, conjunttiva clear, no obvious abnormalities on inspection of external nose and ears  NECK: normal movements of the head and neck  LUNGS: on inspection no signs of respiratory distress, breathing rate appears normal, no obvious gross SOB, gasping or wheezing  CV: no obvious cyanosis  MS: moves all visible extremities without noticeable abnormality  PSYCH/NEURO: pleasant and cooperative, no obvious depression or anxiety, speech and thought processing grossly intact, Cognitive function grossly intact        05/13/2023    3:42 PM 06/08/2022    2:04 PM 05/06/2022    3:28 PM 04/23/2021    3:24 PM  Depression screen PHQ 2/9  Decreased Interest 0 0 0 0  Down, Depressed, Hopeless 0 0 0 0  PHQ - 2 Score 0 0 0 0       06/08/2022     2:04 PM 07/09/2022    8:50 AM 07/09/2022    8:56 PM 05/09/2023    3:54 PM 05/13/2023    3:42 PM  Fall Risk  Falls in the past year? 0   0 0  Was there an injury with Fall? 0   0 0  Fall Risk Category Calculator 0   0 0  Fall Risk Category (Retired) Low      (RETIRED) Patient Fall Risk  Level Low fall risk Low fall risk Moderate fall risk    Patient at Risk for Falls Due to No Fall Risks    No Fall Risks  Fall risk Follow up Falls evaluation completed    Falls evaluation completed     SUMMARY AND PLAN:  Encounter for Medicare annual wellness exam   Discussed applicable health maintenance/preventive health measures and advised and referred or ordered per patient preferences: -advised on vaccines due per guidelines, he declined the covid vaccine, he reports he had the tetanus booster in November of 2016 Health Maintenance  Topic Date Due   COVID-19 Vaccine (4 - 2023-24 season) 05/29/2023 (Originally 03/21/2023)   DTaP/Tdap/Td (3 - Tdap) 05/12/2024 (Originally 05/21/2018)   Medicare Annual Wellness (AWV)  05/12/2024   Colonoscopy  06/17/2030   Pneumonia Vaccine 46+ Years old  Completed   INFLUENZA VACCINE  Completed   Hepatitis C Screening  Completed   Zoster Vaccines- Shingrix  Completed   HPV VACCINES  Aged Out     Education and counseling on the following was provided based on the above review of health and a plan/checklist for the patient, along with additional information discussed, was provided for the patient in the patient instructions :   -Advised and counseled on a healthy lifestyle - including the importance of a healthy diet, regular physical activity, social connections and stress management. -Reviewed patient's current diet. Advised and counseled on a whole foods based healthy diet. A summary of a healthy diet was provided in the Patient Instructions.  -reviewed patient's current physical activity level and discussed exercise guidelines for adults.  -Advise yearly  dental visits at minimum and regular eye exams  Follow up: see patient instructions   Patient Instructions  I really enjoyed getting to talk with you today! I am available on Tuesdays and Thursdays for virtual visits if you have any questions or concerns, or if I can be of any further assistance.   CHECKLIST FROM ANNUAL WELLNESS VISIT:  -Follow up (please call to schedule if not scheduled after visit):   -yearly for annual wellness visit with primary care office  Here is a list of your preventive care/health maintenance measures and the plan for each if any are due:  PLAN For any measures below that may be due:   Health Maintenance  Topic Date Due   COVID-19 Vaccine (4 - 2023-24 season) 05/29/2023 (Originally 03/21/2023)   DTaP/Tdap/Td (3 - Tdap) 05/12/2024 (Originally 05/21/2018)   Medicare Annual Wellness (AWV)  05/12/2024   Colonoscopy  06/17/2030   Pneumonia Vaccine 55+ Years old  Completed   INFLUENZA VACCINE  Completed   Hepatitis C Screening  Completed   Zoster Vaccines- Shingrix  Completed   HPV VACCINES  Aged Out    -See a dentist at least yearly  -Get your eyes checked and then per your eye specialist's recommendations  -Other issues addressed today:   -I have included below further information regarding a healthy whole foods based diet, physical activity guidelines for adults, stress management and opportunities for social connections. I hope you find this information useful.   -----------------------------------------------------------------------------------------------------------------------------------------------------------------------------------------------------------------------------------------------------------  NUTRITION: -eat real food: lots of colorful vegetables (half the plate) and fruits -5-7 servings of vegetables and fruits per day (fresh or steamed is best), exp. 2 servings of vegetables with lunch and dinner and 2 servings of fruit per day.  Berries and greens such as kale and collards are great choices.  -consume on a regular basis: whole grains (make sure first ingredient  on label contains the word "whole"), fresh fruits, fish, nuts, seeds, healthy oils (such as olive oil, avocado oil, grape seed oil) -may eat small amounts of dairy and lean meat on occasion, but avoid processed meats such as ham, bacon, lunch meat, etc. -drink water -try to avoid fast food and pre-packaged foods, processed meat -most experts advise limiting sodium to < 2300mg  per day, should limit further is any chronic conditions such as high blood pressure, heart disease, diabetes, etc. The American Heart Association advised that < 1500mg  is is ideal -try to avoid foods that contain any ingredients with names you do not recognize  -try to avoid sugar/sweets (except for the natural sugar that occurs in fresh fruit) -try to avoid sweet drinks -try to avoid white rice, white bread, pasta (unless whole grain), white or yellow potatoes  EXERCISE GUIDELINES FOR ADULTS: -if you wish to increase your physical activity, do so gradually and with the approval of your doctor -STOP and seek medical care immediately if you have any chest pain, chest discomfort or trouble breathing when starting or increasing exercise  -move and stretch your body, legs, feet and arms when sitting for long periods -Physical activity guidelines for optimal health in adults: -least 150 minutes per week of aerobic exercise (can talk, but not sing) once approved by your doctor, 20-30 minutes of sustained activity or two 10 minute episodes of sustained activity every day.  -resistance training at least 2 days per week if approved by your doctor -balance exercises 3+ days per week:   Stand somewhere where you have something sturdy to hold onto if you lose balance.    1) lift up on toes, start with 5x per day and work up to 20x   2) stand and lift on leg straight out to the side so that foot is a few  inches of the floor, start with 5x each side and work up to 20x each side   3) stand on one foot, start with 5 seconds each side and work up to 20 seconds on each side  If you need ideas or help with getting more active:  -Silver sneakers https://tools.silversneakers.com  -Walk with a Doc: http://www.duncan-williams.com/  -try to include resistance (weight lifting/strength building) and balance exercises twice per week: or the following link for ideas: http://castillo-powell.com/  BuyDucts.dk  STRESS MANAGEMENT: -can try meditating, or just sitting quietly with deep breathing while intentionally relaxing all parts of your body for 5 minutes daily -if you need further help with stress, anxiety or depression please follow up with your primary doctor or contact the wonderful folks at [facility]: [phone]  SOCIAL CONNECTIONS: -options in [place] if you wish to engage in more social and exercise related activities:  -Silver sneakers https://tools.silversneakers.com  -Walk with a Doc: http://www.duncan-williams.com/  -Check out the [facility] Active Adults 50+ section on the [place] of [employer] (hiking clubs, book clubs, cards and games, chess, exercise classes, aquatic classes and much more) - see the website for details: https://www.[place].gov/departments/parks-recreation/active-adults50  -YouTube has lots of exercise videos for different ages and abilities as well  -[facility] (a variety of indoor and outdoor inperson activities for adults). [phone]. [address].  -Virtual Online Classes (a variety of topics): see seniorplanet.org or call [phone]  -consider volunteering at a school, hospice center, church, senior center or elsewhere           Terressa Koyanagi, DO

## 2023-05-13 NOTE — Patient Instructions (Signed)
I really enjoyed getting to talk with you today! I am available on Tuesdays and Thursdays for virtual visits if you have any questions or concerns, or if I can be of any further assistance.   CHECKLIST FROM ANNUAL WELLNESS VISIT:  -Follow up (please call to schedule if not scheduled after visit):   -yearly for annual wellness visit with primary care office  Here is a list of your preventive care/health maintenance measures and the plan for each if any are due:  PLAN For any measures below that may be due:   Health Maintenance  Topic Date Due   COVID-19 Vaccine (4 - 2023-24 season) 05/29/2023 (Originally 03/21/2023)   DTaP/Tdap/Td (3 - Tdap) 05/12/2024 (Originally 05/21/2018)   Medicare Annual Wellness (AWV)  05/12/2024   Colonoscopy  06/17/2030   Pneumonia Vaccine 37+ Years old  Completed   INFLUENZA VACCINE  Completed   Hepatitis C Screening  Completed   Zoster Vaccines- Shingrix  Completed   HPV VACCINES  Aged Out    -See a dentist at least yearly  -Get your eyes checked and then per your eye specialist's recommendations  -Other issues addressed today:   -I have included below further information regarding a healthy whole foods based diet, physical activity guidelines for adults, stress management and opportunities for social connections. I hope you find this information useful.   -----------------------------------------------------------------------------------------------------------------------------------------------------------------------------------------------------------------------------------------------------------  NUTRITION: -eat real food: lots of colorful vegetables (half the plate) and fruits -5-7 servings of vegetables and fruits per day (fresh or steamed is best), exp. 2 servings of vegetables with lunch and dinner and 2 servings of fruit per day. Berries and greens such as kale and collards are great choices.  -consume on a regular basis: whole grains (make  sure first ingredient on label contains the word "whole"), fresh fruits, fish, nuts, seeds, healthy oils (such as olive oil, avocado oil, grape seed oil) -may eat small amounts of dairy and lean meat on occasion, but avoid processed meats such as ham, bacon, lunch meat, etc. -drink water -try to avoid fast food and pre-packaged foods, processed meat -most experts advise limiting sodium to < 2300mg  per day, should limit further is any chronic conditions such as high blood pressure, heart disease, diabetes, etc. The American Heart Association advised that < 1500mg  is is ideal -try to avoid foods that contain any ingredients with names you do not recognize  -try to avoid sugar/sweets (except for the natural sugar that occurs in fresh fruit) -try to avoid sweet drinks -try to avoid white rice, white bread, pasta (unless whole grain), white or yellow potatoes  EXERCISE GUIDELINES FOR ADULTS: -if you wish to increase your physical activity, do so gradually and with the approval of your doctor -STOP and seek medical care immediately if you have any chest pain, chest discomfort or trouble breathing when starting or increasing exercise  -move and stretch your body, legs, feet and arms when sitting for long periods -Physical activity guidelines for optimal health in adults: -least 150 minutes per week of aerobic exercise (can talk, but not sing) once approved by your doctor, 20-30 minutes of sustained activity or two 10 minute episodes of sustained activity every day.  -resistance training at least 2 days per week if approved by your doctor -balance exercises 3+ days per week:   Stand somewhere where you have something sturdy to hold onto if you lose balance.    1) lift up on toes, start with 5x per day and work up to 20x  2) stand and lift on leg straight out to the side so that foot is a few inches of the floor, start with 5x each side and work up to 20x each side   3) stand on one foot, start with 5  seconds each side and work up to 20 seconds on each side  If you need ideas or help with getting more active:  -Silver sneakers https://tools.silversneakers.com  -Walk with a Doc: http://www.duncan-williams.com/  -try to include resistance (weight lifting/strength building) and balance exercises twice per week: or the following link for ideas: http://castillo-powell.com/  BuyDucts.dk  STRESS MANAGEMENT: -can try meditating, or just sitting quietly with deep breathing while intentionally relaxing all parts of your body for 5 minutes daily -if you need further help with stress, anxiety or depression please follow up with your primary doctor or contact the wonderful folks at [facility]: [phone]  SOCIAL CONNECTIONS: -options in [place] if you wish to engage in more social and exercise related activities:  -Silver sneakers https://tools.silversneakers.com  -Walk with a Doc: http://www.duncan-williams.com/  -Check out the [employer] Active Adults 50+ section on the [place] [employer] (hiking clubs, book clubs, cards and games, chess, exercise classes, aquatic classes and much more) - see the website for details: https://www.[place].gov/departments/parks-recreation/active-adults50  -YouTube has lots of exercise videos for different ages and abilities as well  -[facility] (a variety of indoor and outdoor inperson activities for adults). [phone]. [address]  -Virtual Online Classes (a variety of topics): see seniorplanet.org or call [phone]  -consider volunteering at a school, hospice center, church, senior center or elsewhere

## 2023-06-23 DIAGNOSIS — R2681 Unsteadiness on feet: Secondary | ICD-10-CM | POA: Diagnosis not present

## 2023-06-23 DIAGNOSIS — M545 Low back pain, unspecified: Secondary | ICD-10-CM | POA: Diagnosis not present

## 2023-06-23 DIAGNOSIS — M79669 Pain in unspecified lower leg: Secondary | ICD-10-CM | POA: Diagnosis not present

## 2023-07-07 DIAGNOSIS — M545 Low back pain, unspecified: Secondary | ICD-10-CM | POA: Diagnosis not present

## 2023-07-07 DIAGNOSIS — M79669 Pain in unspecified lower leg: Secondary | ICD-10-CM | POA: Diagnosis not present

## 2023-07-07 DIAGNOSIS — R2681 Unsteadiness on feet: Secondary | ICD-10-CM | POA: Diagnosis not present

## 2023-07-19 ENCOUNTER — Ambulatory Visit (INDEPENDENT_AMBULATORY_CARE_PROVIDER_SITE_OTHER): Payer: Medicare Other

## 2023-07-19 ENCOUNTER — Ambulatory Visit (INDEPENDENT_AMBULATORY_CARE_PROVIDER_SITE_OTHER): Payer: Medicare Other | Admitting: Podiatry

## 2023-07-19 DIAGNOSIS — M545 Low back pain, unspecified: Secondary | ICD-10-CM | POA: Diagnosis not present

## 2023-07-19 DIAGNOSIS — M21612 Bunion of left foot: Secondary | ICD-10-CM | POA: Diagnosis not present

## 2023-07-19 DIAGNOSIS — M722 Plantar fascial fibromatosis: Secondary | ICD-10-CM | POA: Diagnosis not present

## 2023-07-19 DIAGNOSIS — R262 Difficulty in walking, not elsewhere classified: Secondary | ICD-10-CM | POA: Diagnosis not present

## 2023-07-19 DIAGNOSIS — M25572 Pain in left ankle and joints of left foot: Secondary | ICD-10-CM | POA: Diagnosis not present

## 2023-07-19 DIAGNOSIS — M25675 Stiffness of left foot, not elsewhere classified: Secondary | ICD-10-CM | POA: Diagnosis not present

## 2023-07-19 MED ORDER — DICLOFENAC SODIUM 75 MG PO TBEC: 75.0000 mg | DELAYED_RELEASE_TABLET | Freq: Two times a day (BID) | ORAL | 2 refills | Status: DC

## 2023-07-19 MED ORDER — TRIAMCINOLONE ACETONIDE 10 MG/ML IJ SUSP
10.0000 mg | Freq: Once | INTRAMUSCULAR | Status: AC
  Administered 2023-07-19: 10 mg via INTRA_ARTICULAR

## 2023-07-19 NOTE — Progress Notes (Signed)
Subjective:   Patient ID: Eric [name], male   DOB: 72 y.o.   MRN: [id number]   HPI patient presents stating he has had a lot of pain in his left arch his left lateral foot up to his lower leg and that he did have a couple injections a number of months ago which were not successful and did not help him and his foot is very flat and sore.  Patient does not smoke likes to play pickle ball 5 days a week    Review of Systems  All other systems reviewed and are negative.       Objective:  Physical Exam Vitals and nursing note reviewed.  Constitutional:      Appearance: He is well-developed.  Pulmonary:     Effort: Pulmonary effort is normal.  Musculoskeletal:        General: Normal range of motion.  Skin:    General: Skin is warm.  Neurological:     Mental Status: He is alert.     Neurovascular status found to be intact muscle strength was found to be adequate range of motion adequate with exquisite discomfort in the mid arch left with fluid buildup around the area significant flatfoot deformity moderate bunion deformity and other structural deformity with pain also dorsal foot lateral foot     Assessment:  Chronic Planter fasciitis mid arch not responding so far with also other compensatory pains occurring and bunion     Plan:  8 MP reviewed at great length.  At this time I did go ahead and I injected the mid arch left 3 mg Kenalog 5 mg Xylocaine I then applied air fracture walker to completely immobilize and take all pressure off the arch and the heel and patient will wear this for the next 3 weeks and be seen back at that time  X-rays indicate significant depression of the arch left over right minimal spur formation noted no indication of stress fracture with moderate arthritis subtalar joint

## 2023-07-22 DIAGNOSIS — R262 Difficulty in walking, not elsewhere classified: Secondary | ICD-10-CM | POA: Diagnosis not present

## 2023-07-22 DIAGNOSIS — M25675 Stiffness of left foot, not elsewhere classified: Secondary | ICD-10-CM | POA: Diagnosis not present

## 2023-07-22 DIAGNOSIS — M545 Low back pain, unspecified: Secondary | ICD-10-CM | POA: Diagnosis not present

## 2023-07-22 DIAGNOSIS — M722 Plantar fascial fibromatosis: Secondary | ICD-10-CM | POA: Diagnosis not present

## 2023-07-22 DIAGNOSIS — M25572 Pain in left ankle and joints of left foot: Secondary | ICD-10-CM | POA: Diagnosis not present

## 2023-08-02 DIAGNOSIS — M722 Plantar fascial fibromatosis: Secondary | ICD-10-CM | POA: Diagnosis not present

## 2023-08-02 DIAGNOSIS — R262 Difficulty in walking, not elsewhere classified: Secondary | ICD-10-CM | POA: Diagnosis not present

## 2023-08-02 DIAGNOSIS — M545 Low back pain, unspecified: Secondary | ICD-10-CM | POA: Diagnosis not present

## 2023-08-02 DIAGNOSIS — M25675 Stiffness of left foot, not elsewhere classified: Secondary | ICD-10-CM | POA: Diagnosis not present

## 2023-08-02 DIAGNOSIS — M25572 Pain in left ankle and joints of left foot: Secondary | ICD-10-CM | POA: Diagnosis not present

## 2023-08-04 DIAGNOSIS — M545 Low back pain, unspecified: Secondary | ICD-10-CM | POA: Diagnosis not present

## 2023-08-04 DIAGNOSIS — M25675 Stiffness of left foot, not elsewhere classified: Secondary | ICD-10-CM | POA: Diagnosis not present

## 2023-08-04 DIAGNOSIS — R262 Difficulty in walking, not elsewhere classified: Secondary | ICD-10-CM | POA: Diagnosis not present

## 2023-08-04 DIAGNOSIS — M722 Plantar fascial fibromatosis: Secondary | ICD-10-CM | POA: Diagnosis not present

## 2023-08-04 DIAGNOSIS — M25572 Pain in left ankle and joints of left foot: Secondary | ICD-10-CM | POA: Diagnosis not present

## 2023-08-09 ENCOUNTER — Ambulatory Visit (INDEPENDENT_AMBULATORY_CARE_PROVIDER_SITE_OTHER): Payer: Medicare Other | Admitting: Podiatry

## 2023-08-09 ENCOUNTER — Encounter: Payer: Self-pay | Admitting: Podiatry

## 2023-08-09 VITALS — Ht 70.0 in | Wt 214.0 lb

## 2023-08-09 DIAGNOSIS — M25675 Stiffness of left foot, not elsewhere classified: Secondary | ICD-10-CM | POA: Diagnosis not present

## 2023-08-09 DIAGNOSIS — R262 Difficulty in walking, not elsewhere classified: Secondary | ICD-10-CM | POA: Diagnosis not present

## 2023-08-09 DIAGNOSIS — M545 Low back pain, unspecified: Secondary | ICD-10-CM | POA: Diagnosis not present

## 2023-08-09 DIAGNOSIS — M722 Plantar fascial fibromatosis: Secondary | ICD-10-CM

## 2023-08-09 DIAGNOSIS — M25572 Pain in left ankle and joints of left foot: Secondary | ICD-10-CM | POA: Diagnosis not present

## 2023-08-10 NOTE — Progress Notes (Signed)
Subjective:   Patient ID: Eric [name], male   DOB: 73 y.o.   MRN: [id number]   HPI Patient states currently he feels better but he has not really tested his heel up to this point   ROS      Objective:  Physical Exam  Neurovascular status intact with the patient's left heel improved but he has not truly tested it up to this point     Assessment:  Acute plantar fasciitis left with inflammation that seems to be improving     Plan:  H&P reviewed and recommended that it is improved but until we see how he does with pickleball and sports we do not know the final resolution.  Patient will be seen back to recheck on an as-needed basis all questions answered today

## 2023-08-23 DIAGNOSIS — D225 Melanocytic nevi of trunk: Secondary | ICD-10-CM | POA: Diagnosis not present

## 2023-08-23 DIAGNOSIS — L218 Other seborrheic dermatitis: Secondary | ICD-10-CM | POA: Diagnosis not present

## 2023-08-23 DIAGNOSIS — L570 Actinic keratosis: Secondary | ICD-10-CM | POA: Diagnosis not present

## 2023-08-23 DIAGNOSIS — L814 Other melanin hyperpigmentation: Secondary | ICD-10-CM | POA: Diagnosis not present

## 2023-08-23 DIAGNOSIS — L918 Other hypertrophic disorders of the skin: Secondary | ICD-10-CM | POA: Diagnosis not present

## 2023-08-23 DIAGNOSIS — Z85828 Personal history of other malignant neoplasm of skin: Secondary | ICD-10-CM | POA: Diagnosis not present

## 2023-08-23 DIAGNOSIS — L821 Other seborrheic keratosis: Secondary | ICD-10-CM | POA: Diagnosis not present

## 2023-08-23 DIAGNOSIS — L820 Inflamed seborrheic keratosis: Secondary | ICD-10-CM | POA: Diagnosis not present

## 2023-08-23 DIAGNOSIS — D224 Melanocytic nevi of scalp and neck: Secondary | ICD-10-CM | POA: Diagnosis not present

## 2023-12-23 DIAGNOSIS — R438 Other disturbances of smell and taste: Secondary | ICD-10-CM | POA: Diagnosis not present

## 2023-12-23 DIAGNOSIS — J339 Nasal polyp, unspecified: Secondary | ICD-10-CM | POA: Diagnosis not present

## 2024-01-28 DIAGNOSIS — M5442 Lumbago with sciatica, left side: Secondary | ICD-10-CM | POA: Diagnosis not present

## 2024-02-03 DIAGNOSIS — M545 Low back pain, unspecified: Secondary | ICD-10-CM | POA: Diagnosis not present

## 2024-02-06 ENCOUNTER — Encounter: Payer: Self-pay | Admitting: Family Medicine

## 2024-02-07 MED ORDER — TADALAFIL 20 MG PO TABS: ORAL_TABLET | ORAL | 1 refills | Status: AC

## 2024-02-11 DIAGNOSIS — M5416 Radiculopathy, lumbar region: Secondary | ICD-10-CM | POA: Diagnosis not present

## 2024-02-17 DIAGNOSIS — M545 Low back pain, unspecified: Secondary | ICD-10-CM | POA: Diagnosis not present

## 2024-02-25 DIAGNOSIS — M5416 Radiculopathy, lumbar region: Secondary | ICD-10-CM | POA: Diagnosis not present

## 2024-03-01 DIAGNOSIS — M5416 Radiculopathy, lumbar region: Secondary | ICD-10-CM | POA: Diagnosis not present

## 2024-03-17 DIAGNOSIS — M5416 Radiculopathy, lumbar region: Secondary | ICD-10-CM | POA: Diagnosis not present

## 2024-03-29 ENCOUNTER — Other Ambulatory Visit: Payer: Self-pay | Admitting: Orthopedic Surgery

## 2024-04-06 ENCOUNTER — Telehealth: Payer: Self-pay

## 2024-04-06 DIAGNOSIS — Z23 Encounter for immunization: Secondary | ICD-10-CM | POA: Diagnosis not present

## 2024-04-06 NOTE — Telephone Encounter (Signed)
 Patient has been scheduled for IN OFFICE VISIT and note has been made on appointment line that clearance is needed

## 2024-04-06 NOTE — Telephone Encounter (Signed)
   Name: Eric [name]  DOB: 11-Aug-1950  MRN: [id number]  Primary Cardiologist: Peter Swaziland, MD  Chart reviewed as part of pre-operative protocol coverage. Because of [name] past medical history and time since last visit, he will require a follow-up in-office visit in order to better assess preoperative cardiovascular risk.  Pre-op covering staff: - Please schedule appointment and call patient to inform them. If patient already had an upcoming appointment within acceptable timeframe, please add pre-op clearance to the appointment notes so provider is aware. - Please contact requesting surgeon's office via preferred method (i.e, phone, fax) to inform them of need for appointment prior to surgery.  No medications are indicated as needing held.  [name], PA-C  04/06/2024, 9:33 AM

## 2024-04-06 NOTE — Telephone Encounter (Signed)
   Pre-operative Risk Assessment    Patient Name: Eric [name]  DOB: 01-07-1951 MRN: [id number]   Date of last office visit: 06/17/22 Date of next office visit: N/A   Request for Surgical Clearance    Procedure:  Left sided lumbar 2 - lumbar 3 transforaminal lumbar interbody fusion with instrumentation and allograft   Date of Surgery:  Clearance 04/13/24                                 Surgeon:  Oneil LITTIE Priestly, MD  Surgeon's Group or Practice Name:  [facility]  Phone number:  [phone] Fax number:  [phone]   Type of Clearance Requested:   - Medical    Type of Anesthesia:  General    Additional requests/questions:    Bonney Rebeca Blight   04/06/2024, 9:08 AM

## 2024-04-07 ENCOUNTER — Encounter (HOSPITAL_COMMUNITY): Payer: Self-pay

## 2024-04-07 NOTE — Pre-Procedure Instructions (Signed)
 Surgical Instructions   Your procedure is scheduled on April 13, 2024. Report to [facility] Main Entrance A at 9:00 A.M., then check in with the Admitting office. Any questions or running late day of surgery: call [phone]  Questions prior to your surgery date: call [phone], Monday-Friday, 8am-4pm. If you experience any cold or flu symptoms such as cough, fever, chills, shortness of breath, etc. between now and your scheduled surgery, please notify us  at the above number.     Remember:  Do not eat after midnight the night before your surgery  You may drink clear liquids until 9:00 the morning of your surgery.   Clear liquids allowed are: Water, Non-Citrus Juices (without pulp), Carbonated Beverages, Clear Tea (no milk, honey, etc.), Black Coffee Only (NO MILK, CREAM OR POWDERED CREAMER of any kind), and Gatorade.  Patient Instructions  The night before surgery:  No food after midnight. ONLY clear liquids after midnight  The day of surgery (if you do NOT have diabetes):  Drink ONE (1) Pre-Surgery Clear Ensure by 9:00am the morning of surgery. Drink in one sitting. Do not sip.  This drink was given to you during your hospital  pre-op appointment visit.  Nothing else to drink after completing the  Pre-Surgery Clear Ensure.         If you have questions, please contact your surgeon's office.     Take these medicines the morning of surgery with A SIP OF WATER : Cetirizine (Zyrtec)   May take these medicines IF NEEDED: Acetaminophen  (Tylenol ) Fluticasone (Flonase) Polyethyl Glycol-Propyl Glycol (SYSTANE) eye drops  One week prior to surgery, STOP taking any Aspirin (unless otherwise instructed by your surgeon) Aleve, Naproxen, Ibuprofen, Motrin, Advil, Goody's, BC's, all herbal medications, fish oil, and non-prescription vitamins.                     Do NOT Smoke (Tobacco/Vaping) for 24 hours prior to your procedure.  If you use a CPAP at night, you may bring  your mask/headgear for your overnight stay.   You will be asked to remove any contacts, glasses, piercing's, hearing aid's, dentures/partials prior to surgery. Please bring cases for these items if needed.    Patients discharged the day of surgery will not be allowed to drive home, and someone needs to stay with them for 24 hours.  SURGICAL WAITING ROOM VISITATION Patients may have no more than 2 support people in the waiting area - these visitors may rotate.   Pre-op nurse will coordinate an appropriate time for 1 ADULT support person, who may not rotate, to accompany patient in pre-op.  Children under the age of 38 must have an adult with them who is not the patient and must remain in the main waiting area with an adult.  If the patient needs to stay at the hospital during part of their recovery, the visitor guidelines for inpatient rooms apply.  Please refer to the [facility] website for the visitor guidelines for any additional information.   If you received a COVID test during your pre-op visit  it is requested that you wear a mask when out in public, stay away from anyone that may not be feeling well and notify your surgeon if you develop symptoms. If you have been in contact with anyone that has tested positive in the last 10 days please notify you surgeon.      Pre-operative 5 CHG Bathing Instructions   You can play a key role in reducing  the risk of infection after surgery. Your skin needs to be as free of germs as possible. You can reduce the number of germs on your skin by washing with CHG (chlorhexidine  gluconate) soap before surgery. CHG is an antiseptic soap that kills germs and continues to kill germs even after washing.   DO NOT use if you have an allergy to chlorhexidine /CHG or antibacterial soaps. If your skin becomes reddened or irritated, stop using the CHG and notify one of our RNs at [phone].   Please shower with the CHG soap starting 4 days before surgery using  the following schedule:     Please keep in mind the following:  DO NOT shave, including legs and underarms, starting the day of your first shower.   You may shave your face at any point before/day of surgery.  Place clean sheets on your bed the day you start using CHG soap. Use a clean washcloth (not used since being washed) for each shower. DO NOT sleep with pets once you start using the CHG.   CHG Shower Instructions:  Wash your face and private area with normal soap. If you choose to wash your hair, wash first with your normal shampoo.  After you use shampoo/soap, rinse your hair and body thoroughly to remove shampoo/soap residue.  Turn the water OFF and apply about 3 tablespoons (45 ml) of CHG soap to a CLEAN washcloth.  Apply CHG soap ONLY FROM YOUR NECK DOWN TO YOUR TOES (washing for 3-5 minutes)  DO NOT use CHG soap on face, private areas, open wounds, or sores.  Pay special attention to the area where your surgery is being performed.  If you are having back surgery, having someone wash your back for you may be helpful. Wait 2 minutes after CHG soap is applied, then you may rinse off the CHG soap.  Pat dry with a clean towel  Put on clean clothes/pajamas   If you choose to wear lotion, please use ONLY the CHG-compatible lotions that are listed below.  Additional instructions for the day of surgery: DO NOT APPLY any lotions, deodorants, cologne, or perfumes.   Do not bring valuables to the hospital. [facility] is not responsible for any belongings/valuables. Do not wear nail polish, gel polish, artificial nails, or any other type of covering on natural nails (fingers and toes) Do not wear jewelry or makeup Put on clean/comfortable clothes.  Please brush your teeth.  Ask your nurse before applying any prescription medications to the skin.     CHG Compatible Lotions   Aveeno Moisturizing lotion  Cetaphil Moisturizing Cream  Cetaphil Moisturizing Lotion  Clairol Herbal  Essence Moisturizing Lotion, Dry Skin  Clairol Herbal Essence Moisturizing Lotion, Extra Dry Skin  Clairol Herbal Essence Moisturizing Lotion, Normal Skin  Curel Age Defying Therapeutic Moisturizing Lotion with Alpha Hydroxy  Curel Extreme Care Body Lotion  Curel Soothing Hands Moisturizing Hand Lotion  Curel Therapeutic Moisturizing Cream, Fragrance-Free  Curel Therapeutic Moisturizing Lotion, Fragrance-Free  Curel Therapeutic Moisturizing Lotion, Original Formula  Eucerin Daily Replenishing Lotion  Eucerin Dry Skin Therapy Plus Alpha Hydroxy Crme  Eucerin Dry Skin Therapy Plus Alpha Hydroxy Lotion  Eucerin Original Crme  Eucerin Original Lotion  Eucerin Plus Crme Eucerin Plus Lotion  Eucerin TriLipid Replenishing Lotion  Keri Anti-Bacterial Hand Lotion  Keri Deep Conditioning Original Lotion Dry Skin Formula Softly Scented  Keri Deep Conditioning Original Lotion, Fragrance Free Sensitive Skin Formula  Keri Lotion Fast Absorbing Fragrance Free Sensitive Skin Formula  Keri Lotion  Fast Absorbing Softly Scented Dry Skin Formula  Keri Original Lotion  Keri Skin Renewal Lotion Keri Silky Smooth Lotion  Keri Silky Smooth Sensitive Skin Lotion  Nivea Body Creamy Conditioning Oil  Nivea Body Extra Enriched Lotion  Nivea Body Original Lotion  Nivea Body Sheer Moisturizing Lotion Nivea Crme  Nivea Skin Firming Lotion  NutraDerm 30 Skin Lotion  NutraDerm Skin Lotion  NutraDerm Therapeutic Skin Cream  NutraDerm Therapeutic Skin Lotion  ProShield Protective Hand Cream  Provon moisturizing lotion  Please read over the following fact sheets that you were given.

## 2024-04-10 ENCOUNTER — Encounter (HOSPITAL_COMMUNITY): Payer: Self-pay

## 2024-04-10 ENCOUNTER — Other Ambulatory Visit: Payer: Self-pay

## 2024-04-10 ENCOUNTER — Encounter (HOSPITAL_COMMUNITY)
Admission: RE | Admit: 2024-04-10 | Discharge: 2024-04-10 | Disposition: A | Discharge status: Home / Self Care | Source: Ambulatory Visit | Attending: Orthopedic Surgery

## 2024-04-10 VITALS — BP 138/97 | HR 96 | Temp 98.3°F | Resp 17 | Ht 70.0 in | Wt 218.5 lb

## 2024-04-10 DIAGNOSIS — I452 Bifascicular block: Secondary | ICD-10-CM | POA: Diagnosis not present

## 2024-04-10 DIAGNOSIS — Z01818 Encounter for other preprocedural examination: Secondary | ICD-10-CM | POA: Diagnosis not present

## 2024-04-10 LAB — BASIC METABOLIC PANEL WITH GFR
Anion gap: 14 (ref 5–15)
BUN: 10 mg/dL (ref 8–23)
CO2: 21 mmol/L — ABNORMAL LOW (ref 22–32)
Calcium: 9.3 mg/dL (ref 8.9–10.3)
Chloride: 100 mmol/L (ref 98–111)
Creatinine, Ser: 0.84 mg/dL (ref 0.61–1.24)
GFR, Estimated: >60 mL/min (ref >60)
Glucose, Bld: 86 mg/dL (ref 70–99)
Potassium: 3.2 mmol/L — ABNORMAL LOW (ref 3.5–5.1)
Sodium: 135 mmol/L (ref 135–145)

## 2024-04-10 LAB — CBC
HCT: 42.8 % (ref 39.0–52.0)
Hemoglobin: 14.7 g/dL (ref 13.0–17.0)
MCH: 32.5 pg (ref 26.0–34.0)
MCHC: 34.3 g/dL (ref 30.0–36.0)
MCV: 94.5 fL (ref 80.0–100.0)
Platelets: 227 K/uL (ref 150–400)
RBC: 4.53 MIL/uL (ref 4.22–5.81)
RDW: 11.9 % (ref 11.5–15.5)
WBC: 6.6 K/uL (ref 4.0–10.5)
nRBC: 0 % (ref 0.0–0.2)

## 2024-04-10 LAB — TYPE AND SCREEN
ABO/RH(D): A POS
Antibody Screen: NEGATIVE

## 2024-04-10 LAB — SURGICAL PCR SCREEN
MRSA, PCR: NEGATIVE
Staphylococcus aureus: NEGATIVE

## 2024-04-10 NOTE — Progress Notes (Signed)
 Per Ar[place] patient's sx will be at 1130 am and patient needs to be at [facility] at 0830 AM.

## 2024-04-10 NOTE — Progress Notes (Signed)
 PCP - Micheal Pin, MD Cardiologist - Swaziland, Peter, MD  PPM/ICD - denies Device Orders - n/a Rep Notified - n/a  Chest x-ray - denies EKG - 04/10/2024 Stress Test - denies ECHO - denies Cardiac Cath - denies  Sleep Study - denies CPAP - n/a  Fasting Blood Sugar - no DM Checks Blood Sugar _____ times a day  Last dose of GLP1 agonist-  n/a GLP1 instructions: n/a  Blood Thinner Instructions: n/a Aspirin Instructions: n/a  ERAS Protcol - yes, till 0830 am PRE-SURGERY Ensure or G2- Ensure  COVID TEST- n/a   Anesthesia review: cardiac clearance on 09/22 with dr. Swaziland; abnormal EKG  Patient denies shortness of breath, fever, cough and chest pain at PAT appointment   All instructions explained to the patient, with a verbal understanding of the material. Patient agrees to go over the instructions while at home for a better understanding. Patient also instructed to self quarantine after being tested for COVID-19. The opportunity to ask questions was provided.

## 2024-04-11 ENCOUNTER — Encounter (HOSPITAL_BASED_OUTPATIENT_CLINIC_OR_DEPARTMENT_OTHER): Payer: Self-pay | Admitting: Family

## 2024-04-11 ENCOUNTER — Ambulatory Visit (INDEPENDENT_AMBULATORY_CARE_PROVIDER_SITE_OTHER): Admitting: Family

## 2024-04-11 VITALS — BP 129/88 | HR 106 | Resp 17 | Ht 70.0 in | Wt 219.0 lb

## 2024-04-11 DIAGNOSIS — I444 Left anterior fascicular block: Secondary | ICD-10-CM | POA: Diagnosis not present

## 2024-04-11 DIAGNOSIS — Z01818 Encounter for other preprocedural examination: Secondary | ICD-10-CM

## 2024-04-11 DIAGNOSIS — I4510 Unspecified right bundle-branch block: Secondary | ICD-10-CM

## 2024-04-11 NOTE — Progress Notes (Signed)
  Cardiology Office Note   Date:  04/11/2024  ID:  Eric [name], DOB 1950-09-13, MRN [id number] PCP: Micheal Wolm ORN, MD  [facility] Providers Cardiologist:  Peter Swaziland, MD     History of Present Illness Eric [name] is a 73 y.o. male with hx of HLD, RBBB/LAFB  Evaluated by Dr. Swaziland 06/17/22 for abnormal EKG with RBBB and LAFB which were also noted during colonoscopy 2 months prior. He had no cardiac symptoms. No further testing recommended. Advised to follow up with cardiology PRN.   Pending transforaminal lumbar body interbody fusion with Dr. Beuford for 04/13/24.   He presents today for cardiac clearance. He denies chest pain, pressure, heaviness, tightness. Denies shortness of breath, palpitations, edema. Notes weight gain due to limited activity related to back pain. He is still doing about 20 minutes of pickle ball 5 days/weekly at [employer]. Is not taking aspirin, fish oil, vit E. No prior history of anesthesia complications.   ROS: Please see the history of present illness.    All other systems reviewed and are negative.   Studies Reviewed          Risk Assessment/Calculations           Physical Exam VS:  BP 129/88 (BP Location: Left Arm, Patient Position: Sitting, Cuff Size: Normal)   Pulse (!) 106   Resp 17   Ht 5' 10 (1.778 m)   Wt 219 lb (99.3 kg)   SpO2 95%   BMI 31.42 kg/m        Wt Readings from Last 3 Encounters:  04/11/24 219 lb (99.3 kg)  04/10/24 218 lb 8 oz (99.1 kg)  08/09/23 214 lb (97.1 kg)    GEN: Well nourished, well developed in no acute distress NECK: No JVD; No carotid bruits CARDIAC: sinus tachycardia, no murmurs, rubs, gallops; radial pulses 2+ bilaterally RESPIRATORY:  Clear to auscultation without rales, wheezing or rhonchi  ABDOMEN: Soft, non-tender, non-distended EXTREMITIES:  No edema; No deformity   ASSESSMENT AND PLAN  Preop clearance - According to the Revised Cardiac Risk Index (RCRI), his  Perioperative Risk of Major Cardiac Event is (%): 0.4. His Functional Capacity in METs is: 5.47 according to the Duke Activity Status Index (DASI). Per AHA/ACC guidelines, he is deemed acceptable risk for the planned procedure without additional cardiovascular testing. Will route to surgical team so they are aware.     RBBB / LAFB - noted on EKG 05/2022 from prior cardiac clearance and unchanged/stable on EKG 04/11/24. Denies lightheadedness, dizziness, pre-syncope, or syncope. He will update PCP or cardiology if he becomes symptomatic with lightheadedness or syncope Avoid AV nodal blocking agents       Dispo: PRN  Signed, Reche GORMAN Finder, NP

## 2024-04-11 NOTE — Patient Instructions (Signed)
 Medication Instructions:   Your physician recommends that you continue on your current medications as directed. Please refer to the Current Medication list given to you today.  *If you need a refill on your cardiac medications before your next appointment, please call your pharmacy*     Follow-Up:  As needed with Dr. Swaziland

## 2024-04-13 ENCOUNTER — Other Ambulatory Visit: Payer: Self-pay

## 2024-04-13 ENCOUNTER — Ambulatory Visit (HOSPITAL_COMMUNITY)
Admission: RE | Disposition: A | Discharge status: Home / Self Care | Payer: Self-pay | Source: Home / Self Care | Attending: Orthopedic Surgery

## 2024-04-13 ENCOUNTER — Ambulatory Visit (HOSPITAL_COMMUNITY)

## 2024-04-13 ENCOUNTER — Observation Stay (HOSPITAL_COMMUNITY)
Admission: RE | Admit: 2024-04-13 | Discharge: 2024-04-14 | Disposition: A | Discharge status: Home / Self Care | Attending: Orthopedic Surgery | Admitting: Orthopedic Surgery

## 2024-04-13 ENCOUNTER — Ambulatory Visit (HOSPITAL_COMMUNITY): Payer: Self-pay | Admitting: Physician Assistant

## 2024-04-13 ENCOUNTER — Encounter (HOSPITAL_COMMUNITY): Payer: Self-pay | Admitting: Orthopedic Surgery

## 2024-04-13 ENCOUNTER — Ambulatory Visit (HOSPITAL_BASED_OUTPATIENT_CLINIC_OR_DEPARTMENT_OTHER): Admitting: Certified Registered Nurse Anesthetist

## 2024-04-13 DIAGNOSIS — M4856XA Collapsed vertebra, not elsewhere classified, lumbar region, initial encounter for fracture: Secondary | ICD-10-CM | POA: Insufficient documentation

## 2024-04-13 DIAGNOSIS — Z87891 Personal history of nicotine dependence: Secondary | ICD-10-CM | POA: Diagnosis not present

## 2024-04-13 DIAGNOSIS — M5416 Radiculopathy, lumbar region: Principal | ICD-10-CM | POA: Insufficient documentation

## 2024-04-13 DIAGNOSIS — M48061 Spinal stenosis, lumbar region without neurogenic claudication: Secondary | ICD-10-CM | POA: Insufficient documentation

## 2024-04-13 DIAGNOSIS — Z981 Arthrodesis status: Secondary | ICD-10-CM | POA: Diagnosis not present

## 2024-04-13 DIAGNOSIS — M79605 Pain in left leg: Secondary | ICD-10-CM | POA: Diagnosis present

## 2024-04-13 HISTORY — PX: TRANSFORAMINAL LUMBAR INTERBODY FUSION (TLIF) WITH PEDICLE SCREW FIXATION 1 LEVEL: SHX6141

## 2024-04-13 LAB — POCT I-STAT, CHEM 8
BUN: 17 mg/dL (ref 8–23)
Calcium, Ion: 1.23 mmol/L (ref 1.15–1.40)
Chloride: 105 mmol/L (ref 98–111)
Creatinine, Ser: 0.9 mg/dL (ref 0.61–1.24)
Glucose, Bld: 177 mg/dL — ABNORMAL HIGH (ref 70–99)
HCT: 39 % (ref 39.0–52.0)
Hemoglobin: 13.3 g/dL (ref 13.0–17.0)
Potassium: 3.7 mmol/L (ref 3.5–5.1)
Sodium: 139 mmol/L (ref 135–145)
TCO2: 23 mmol/L (ref 22–32)

## 2024-04-13 SURGERY — TRANSFORAMINAL LUMBAR INTERBODY FUSION (TLIF) WITH PEDICLE SCREW FIXATION 1 LEVEL
Anesthesia: General | Site: Spine Lumbar | Laterality: Left

## 2024-04-13 MED ORDER — THROMBIN 20000 UNITS EX KIT
PACK | CUTANEOUS | Status: DC | PRN
  Administered 2024-04-13: 20 mL via TOPICAL

## 2024-04-13 MED ORDER — FLEET ENEMA RE ENEM: 1.0000 | ENEMA | Freq: Once | RECTAL | Status: DC | PRN

## 2024-04-13 MED ORDER — OXYCODONE HCL 5 MG/5ML PO SOLN: 5.0000 mg | Freq: Once | ORAL | Status: AC | PRN

## 2024-04-13 MED ORDER — MELATONIN 5 MG PO TABS
5.0000 mg | ORAL_TABLET | Freq: Every evening | ORAL | Status: DC | PRN
  Administered 2024-04-13: 5 mg via ORAL
  Filled 2024-04-13: qty 1

## 2024-04-13 MED ORDER — SODIUM CHLORIDE 0.9% FLUSH: 3.0000 mL | INTRAVENOUS | Status: DC | PRN

## 2024-04-13 MED ORDER — LIDOCAINE 2% (20 MG/ML) 5 ML SYRINGE
INTRAMUSCULAR | Status: DC | PRN
  Administered 2024-04-13: 80 mg via INTRAVENOUS

## 2024-04-13 MED ORDER — FENTANYL CITRATE (PF) 250 MCG/5ML IJ SOLN
INTRAMUSCULAR | Status: DC | PRN
  Administered 2024-04-13: 50 ug via INTRAVENOUS
  Administered 2024-04-13: 150 ug via INTRAVENOUS
  Administered 2024-04-13: 50 ug via INTRAVENOUS

## 2024-04-13 MED ORDER — MORPHINE SULFATE (PF) 2 MG/ML IV SOLN: 2.0000 mg | INTRAVENOUS | Status: DC | PRN

## 2024-04-13 MED ORDER — HYDROCODONE-ACETAMINOPHEN 5-325 MG PO TABS: 1.0000 | ORAL_TABLET | ORAL | Status: DC | PRN

## 2024-04-13 MED ORDER — 0.9 % SODIUM CHLORIDE (POUR BTL) OPTIME
TOPICAL | Status: DC | PRN
  Administered 2024-04-13: 1000 mL

## 2024-04-13 MED ORDER — LACTATED RINGERS IV SOLN
INTRAVENOUS | Status: DC
Start: 2024-04-13 — End: 2024-04-13

## 2024-04-13 MED ORDER — OXYCODONE HCL 5 MG PO TABS
5.0000 mg | ORAL_TABLET | Freq: Once | ORAL | Status: AC | PRN
  Administered 2024-04-13: 5 mg via ORAL

## 2024-04-13 MED ORDER — FENTANYL CITRATE (PF) 250 MCG/5ML IJ SOLN
INTRAMUSCULAR | Status: AC
  Filled 2024-04-13: qty 5

## 2024-04-13 MED ORDER — OXYCODONE-ACETAMINOPHEN 5-325 MG PO TABS
1.0000 | ORAL_TABLET | ORAL | Status: DC | PRN
  Administered 2024-04-13 – 2024-04-14 (×5): 2 via ORAL
  Filled 2024-04-13 (×5): qty 2

## 2024-04-13 MED ORDER — BUPIVACAINE-EPINEPHRINE (PF) 0.25% -1:200000 IJ SOLN
INTRAMUSCULAR | Status: AC
  Filled 2024-04-13: qty 30

## 2024-04-13 MED ORDER — BISACODYL 5 MG PO TBEC
5.0000 mg | DELAYED_RELEASE_TABLET | Freq: Every day | ORAL | Status: DC | PRN
  Filled 2024-04-13: qty 1

## 2024-04-13 MED ORDER — ONDANSETRON HCL 4 MG/2ML IJ SOLN
INTRAMUSCULAR | Status: DC | PRN
  Administered 2024-04-13: 4 mg via INTRAVENOUS

## 2024-04-13 MED ORDER — ACETAMINOPHEN 650 MG RE SUPP: 650.0000 mg | RECTAL | Status: DC | PRN

## 2024-04-13 MED ORDER — SODIUM CHLORIDE 0.9% FLUSH
3.0000 mL | Freq: Two times a day (BID) | INTRAVENOUS | Status: DC
  Administered 2024-04-13: 3 mL via INTRAVENOUS

## 2024-04-13 MED ORDER — POVIDONE-IODINE 7.5 % EX SOLN
Freq: Once | CUTANEOUS | Status: AC
  Filled 2024-04-13: qty 118

## 2024-04-13 MED ORDER — ONDANSETRON HCL 4 MG PO TABS: 4.0000 mg | ORAL_TABLET | Freq: Four times a day (QID) | ORAL | Status: DC | PRN

## 2024-04-13 MED ORDER — HYDROMORPHONE HCL 1 MG/ML IJ SOLN
INTRAMUSCULAR | Status: AC
  Filled 2024-04-13: qty 1

## 2024-04-13 MED ORDER — ROCURONIUM BROMIDE 10 MG/ML (PF) SYRINGE
PREFILLED_SYRINGE | INTRAVENOUS | Status: AC
  Filled 2024-04-13: qty 10

## 2024-04-13 MED ORDER — DEXAMETHASONE SODIUM PHOSPHATE 10 MG/ML IJ SOLN
INTRAMUSCULAR | Status: AC
Start: 2024-04-13 — End: 2024-04-13
  Filled 2024-04-13: qty 1

## 2024-04-13 MED ORDER — ROCURONIUM BROMIDE 10 MG/ML (PF) SYRINGE
PREFILLED_SYRINGE | INTRAVENOUS | Status: DC | PRN
  Administered 2024-04-13: 70 mg via INTRAVENOUS
  Administered 2024-04-13: 30 mg via INTRAVENOUS
  Administered 2024-04-13 (×2): 20 mg via INTRAVENOUS

## 2024-04-13 MED ORDER — POLYVINYL ALCOHOL 1.4 % OP SOLN: 1.0000 [drp] | Freq: Three times a day (TID) | OPHTHALMIC | Status: DC | PRN

## 2024-04-13 MED ORDER — PHENYLEPHRINE 80 MCG/ML (10ML) SYRINGE FOR IV PUSH (FOR BLOOD PRESSURE SUPPORT)
PREFILLED_SYRINGE | INTRAVENOUS | Status: AC
  Filled 2024-04-13: qty 10

## 2024-04-13 MED ORDER — CEFAZOLIN SODIUM-DEXTROSE 2-4 GM/100ML-% IV SOLN
2.0000 g | Freq: Three times a day (TID) | INTRAVENOUS | Status: AC
  Administered 2024-04-13 – 2024-04-14 (×2): 2 g via INTRAVENOUS
  Filled 2024-04-13 (×2): qty 100

## 2024-04-13 MED ORDER — SENNOSIDES-DOCUSATE SODIUM 8.6-50 MG PO TABS
1.0000 | ORAL_TABLET | Freq: Every evening | ORAL | Status: DC | PRN
  Administered 2024-04-13: 1 via ORAL
  Filled 2024-04-13: qty 1

## 2024-04-13 MED ORDER — MIDAZOLAM HCL 2 MG/2ML IJ SOLN
INTRAMUSCULAR | Status: DC | PRN
  Administered 2024-04-13 (×2): 1 mg via INTRAVENOUS

## 2024-04-13 MED ORDER — DOCUSATE SODIUM 100 MG PO CAPS
100.0000 mg | ORAL_CAPSULE | Freq: Two times a day (BID) | ORAL | Status: DC
Start: 2024-04-13 — End: 2024-04-14
  Administered 2024-04-13 – 2024-04-14 (×2): 100 mg via ORAL
  Filled 2024-04-13 (×2): qty 1

## 2024-04-13 MED ORDER — METHOCARBAMOL 500 MG PO TABS
500.0000 mg | ORAL_TABLET | Freq: Four times a day (QID) | ORAL | Status: DC | PRN
  Administered 2024-04-13 – 2024-04-14 (×3): 500 mg via ORAL
  Filled 2024-04-13 (×3): qty 1

## 2024-04-13 MED ORDER — LORATADINE 10 MG PO TABS
10.0000 mg | ORAL_TABLET | Freq: Every day | ORAL | Status: DC
  Administered 2024-04-14: 10 mg via ORAL
  Filled 2024-04-13: qty 1

## 2024-04-13 MED ORDER — LIDOCAINE 2% (20 MG/ML) 5 ML SYRINGE
INTRAMUSCULAR | Status: AC
  Filled 2024-04-13: qty 5

## 2024-04-13 MED ORDER — ADULT MULTIVITAMIN W/MINERALS CH
1.0000 | ORAL_TABLET | Freq: Every day | ORAL | Status: DC
  Administered 2024-04-14: 1 via ORAL
  Filled 2024-04-13: qty 1

## 2024-04-13 MED ORDER — BUPIVACAINE-EPINEPHRINE (PF) 0.25% -1:200000 IJ SOLN
INTRAMUSCULAR | Status: DC | PRN
  Administered 2024-04-13: 40 mL

## 2024-04-13 MED ORDER — PHENOL 1.4 % MT LIQD: 1.0000 | OROMUCOSAL | Status: DC | PRN

## 2024-04-13 MED ORDER — THROMBIN 20000 UNITS EX SOLR
CUTANEOUS | Status: AC
  Filled 2024-04-13: qty 20000

## 2024-04-13 MED ORDER — DROPERIDOL 2.5 MG/ML IJ SOLN: 0.6250 mg | Freq: Once | INTRAMUSCULAR | Status: DC | PRN

## 2024-04-13 MED ORDER — VITAMIN D 25 MCG (1000 UNIT) PO TABS
1000.0000 [IU] | ORAL_TABLET | Freq: Every morning | ORAL | Status: DC
  Administered 2024-04-14: 1000 [IU] via ORAL
  Filled 2024-04-13: qty 1

## 2024-04-13 MED ORDER — POTASSIUM CHLORIDE IN NACL 20-0.9 MEQ/L-% IV SOLN
INTRAVENOUS | Status: DC
  Filled 2024-04-13: qty 1000

## 2024-04-13 MED ORDER — SUGAMMADEX SODIUM 200 MG/2ML IV SOLN
INTRAVENOUS | Status: DC | PRN
  Administered 2024-04-13: 200 mg via INTRAVENOUS

## 2024-04-13 MED ORDER — CHLORHEXIDINE GLUCONATE 0.12 % MT SOLN
15.0000 mL | Freq: Once | OROMUCOSAL | Status: AC
  Administered 2024-04-13: 15 mL via OROMUCOSAL
  Filled 2024-04-13: qty 15

## 2024-04-13 MED ORDER — CEFAZOLIN SODIUM-DEXTROSE 2-4 GM/100ML-% IV SOLN
2.0000 g | INTRAVENOUS | Status: AC
  Administered 2024-04-13: 2 g via INTRAVENOUS
  Filled 2024-04-13: qty 100

## 2024-04-13 MED ORDER — ALBUMIN HUMAN 5 % IV SOLN: INTRAVENOUS | Status: DC | PRN

## 2024-04-13 MED ORDER — ONDANSETRON HCL 4 MG/2ML IJ SOLN: 4.0000 mg | Freq: Four times a day (QID) | INTRAMUSCULAR | Status: DC | PRN

## 2024-04-13 MED ORDER — ACETAMINOPHEN 325 MG PO TABS: 650.0000 mg | ORAL_TABLET | ORAL | Status: DC | PRN

## 2024-04-13 MED ORDER — DEXAMETHASONE SODIUM PHOSPHATE 10 MG/ML IJ SOLN
INTRAMUSCULAR | Status: DC | PRN
  Administered 2024-04-13: 10 mg via INTRAVENOUS

## 2024-04-13 MED ORDER — BUPIVACAINE LIPOSOME 1.3 % IJ SUSP
INTRAMUSCULAR | Status: AC
  Filled 2024-04-13: qty 20

## 2024-04-13 MED ORDER — ORAL CARE MOUTH RINSE: 15.0000 mL | Freq: Once | OROMUCOSAL | Status: AC

## 2024-04-13 MED ORDER — OXYCODONE HCL 5 MG PO TABS
ORAL_TABLET | ORAL | Status: AC
  Filled 2024-04-13: qty 1

## 2024-04-13 MED ORDER — MIDAZOLAM HCL 2 MG/2ML IJ SOLN
INTRAMUSCULAR | Status: AC
  Filled 2024-04-13: qty 2

## 2024-04-13 MED ORDER — PROPOFOL 10 MG/ML IV BOLUS
INTRAVENOUS | Status: DC | PRN
  Administered 2024-04-13: 200 mg via INTRAVENOUS

## 2024-04-13 MED ORDER — MENTHOL 3 MG MT LOZG: 1.0000 | LOZENGE | OROMUCOSAL | Status: DC | PRN

## 2024-04-13 MED ORDER — METHOCARBAMOL 1000 MG/10ML IJ SOLN: 500.0000 mg | Freq: Four times a day (QID) | INTRAMUSCULAR | Status: DC | PRN

## 2024-04-13 MED ORDER — DEXMEDETOMIDINE HCL IN NACL 80 MCG/20ML IV SOLN
INTRAVENOUS | Status: DC | PRN
  Administered 2024-04-13 (×2): 8 ug via INTRAVENOUS

## 2024-04-13 MED ORDER — PHENYLEPHRINE HCL-NACL 20-0.9 MG/250ML-% IV SOLN
INTRAVENOUS | Status: DC | PRN
  Administered 2024-04-13: 15 ug/min via INTRAVENOUS
  Administered 2024-04-13: 25 ug/min via INTRAVENOUS

## 2024-04-13 MED ORDER — SODIUM CHLORIDE 0.9 % IV SOLN: 250.0000 mL | INTRAVENOUS | Status: DC

## 2024-04-13 MED ORDER — BUPIVACAINE-EPINEPHRINE 0.25% -1:200000 IJ SOLN
INTRAMUSCULAR | Status: DC | PRN
  Administered 2024-04-13: 9 mL

## 2024-04-13 MED ORDER — ONDANSETRON HCL 4 MG/2ML IJ SOLN
INTRAMUSCULAR | Status: AC
  Filled 2024-04-13: qty 2

## 2024-04-13 MED ORDER — EPHEDRINE 5 MG/ML INJ
INTRAVENOUS | Status: AC
  Filled 2024-04-13: qty 5

## 2024-04-13 MED ORDER — FLUTICASONE PROPIONATE 50 MCG/ACT NA SUSP
1.0000 | Freq: Every day | NASAL | Status: DC | PRN
Start: 2024-04-13 — End: 2024-04-14

## 2024-04-13 MED ORDER — ZOLPIDEM TARTRATE 5 MG PO TABS: 5.0000 mg | ORAL_TABLET | Freq: Every evening | ORAL | Status: DC | PRN

## 2024-04-13 MED ORDER — ONDANSETRON HCL 4 MG/2ML IJ SOLN: 4.0000 mg | Freq: Once | INTRAMUSCULAR | Status: DC | PRN

## 2024-04-13 MED ORDER — PROPOFOL 10 MG/ML IV BOLUS
INTRAVENOUS | Status: AC
  Filled 2024-04-13: qty 20

## 2024-04-13 MED ORDER — HYDROMORPHONE HCL 1 MG/ML IJ SOLN
0.2500 mg | INTRAMUSCULAR | Status: DC | PRN
  Administered 2024-04-13 (×4): 0.5 mg via INTRAVENOUS

## 2024-04-13 MED ORDER — ALUM & MAG HYDROXIDE-SIMETH 200-200-20 MG/5ML PO SUSP: 30.0000 mL | Freq: Four times a day (QID) | ORAL | Status: DC | PRN

## 2024-04-13 MED ORDER — LACTATED RINGERS IV SOLN: INTRAVENOUS | Status: DC | PRN

## 2024-04-13 SURGICAL SUPPLY — 81 items
BAG COUNTER SPONGE SURGICOUNT (BAG) ×1 IMPLANT
BENZOIN TINCTURE PRP APPL 2/3 (GAUZE/BANDAGES/DRESSINGS) ×1 IMPLANT
BLADE CLIPPER SURG (BLADE) IMPLANT
BUR PRECISION FLUTE 5.0 (BURR) ×2 IMPLANT
BUR PRESCISION 1.7 ELITE (BURR) ×1 IMPLANT
BUR ROUND PRECISION 4.0 (BURR) IMPLANT
BUR SABER RD CUTTING 3.0 (BURR) IMPLANT
CAGE SABLE 10X22 6-12 0D (Cage) IMPLANT
CANNULA GRAFT BNE VG PRE-FILL (Bone Implant) IMPLANT
CNTNR URN SCR LID CUP LEK RST (MISCELLANEOUS) ×1 IMPLANT
COVER BACK TABLE 60X90IN (DRAPES) ×1 IMPLANT
COVER MAYO STAND STRL (DRAPES) ×2 IMPLANT
COVER SURGICAL LIGHT HANDLE (MISCELLANEOUS) ×1 IMPLANT
DISPENSER GRAFT BNE VG (MISCELLANEOUS) IMPLANT
DRAPE C-ARM 42X72 X-RAY (DRAPES) ×1 IMPLANT
DRAPE C-ARMOR (DRAPES) IMPLANT
DRAPE POUCH INSTRU U-SHP 10X18 (DRAPES) ×1 IMPLANT
DRAPE SURG 17X23 STRL (DRAPES) ×4 IMPLANT
DURAPREP 26ML APPLICATOR (WOUND CARE) ×1 IMPLANT
ELECT CAUTERY BLADE 6.4 (BLADE) ×1 IMPLANT
ELECTRODE BLDE 4.0 EZ CLN MEGD (MISCELLANEOUS) ×1 IMPLANT
ELECTRODE REM PT RTRN 9FT ADLT (ELECTROSURGICAL) ×1 IMPLANT
EVACUATOR SILICONE 100CC (DRAIN) IMPLANT
FILTER STRAW FLUID ASPIR (MISCELLANEOUS) ×1 IMPLANT
GAUZE 4X4 16PLY ~~LOC~~+RFID DBL (SPONGE) ×1 IMPLANT
GAUZE SPONGE 4X4 12PLY STRL (GAUZE/BANDAGES/DRESSINGS) ×1 IMPLANT
GLOVE BIO SURGEON STRL SZ 6.5 (GLOVE) ×1 IMPLANT
GLOVE BIO SURGEON STRL SZ8 (GLOVE) ×1 IMPLANT
GLOVE BIOGEL PI IND STRL 7.0 (GLOVE) ×1 IMPLANT
GLOVE BIOGEL PI IND STRL 8 (GLOVE) ×1 IMPLANT
GLOVE SURG ENC MOIS LTX SZ6.5 (GLOVE) ×1 IMPLANT
GOWN STRL REUS W/ TWL LRG LVL3 (GOWN DISPOSABLE) ×2 IMPLANT
GOWN STRL REUS W/ TWL XL LVL3 (GOWN DISPOSABLE) ×1 IMPLANT
IV CATH 14GX2 1/4 (CATHETERS) ×1 IMPLANT
KIT BASIN OR (CUSTOM PROCEDURE TRAY) ×1 IMPLANT
KIT POSITIONER JACKSON TABLE (MISCELLANEOUS) ×1 IMPLANT
KIT TURNOVER KIT B (KITS) ×1 IMPLANT
MARKER SKIN DUAL TIP RULER LAB (MISCELLANEOUS) ×2 IMPLANT
NDL 18GX1X1/2 (RX/OR ONLY) (NEEDLE) ×1 IMPLANT
NDL 22X1.5 STRL (OR ONLY) (MISCELLANEOUS) ×2 IMPLANT
NDL HYPO 25GX1X1/2 BEV (NEEDLE) ×1 IMPLANT
NDL SPNL 18GX3.5 QUINCKE PK (NEEDLE) ×2 IMPLANT
NEEDLE 18GX1X1/2 (RX/OR ONLY) (NEEDLE) IMPLANT
NEEDLE 22X1.5 STRL (OR ONLY) (MISCELLANEOUS) ×1 IMPLANT
NEEDLE HYPO 25GX1X1/2 BEV (NEEDLE) ×1 IMPLANT
NEEDLE SPNL 18GX3.5 QUINCKE PK (NEEDLE) ×2 IMPLANT
PACK LAMINECTOMY ORTHO (CUSTOM PROCEDURE TRAY) ×1 IMPLANT
PACK UNIVERSAL I (CUSTOM PROCEDURE TRAY) ×1 IMPLANT
PAD ARMBOARD POSITIONER FOAM (MISCELLANEOUS) ×2 IMPLANT
PATTIES SURGICAL .5 X.5 (GAUZE/BANDAGES/DRESSINGS) ×1 IMPLANT
PATTIES SURGICAL .5 X1 (DISPOSABLE) IMPLANT
PATTIES SURGICAL .5X1.5 (GAUZE/BANDAGES/DRESSINGS) IMPLANT
PENCIL BUTTON HOLSTER BLD 10FT (ELECTRODE) IMPLANT
PUTTY DBX 2.5CC DEPUY (Putty) IMPLANT
ROD PRE LORDOSED 5.5X45 (Rod) IMPLANT
SCREW CORTICAL VIPER 7X40MM (Screw) IMPLANT
SCREW SET SINGLE INNER (Screw) IMPLANT
SCREW VIPER CORT FIX 6.00X30 (Screw) IMPLANT
SOLN 0.9% NACL 1000 ML (IV SOLUTION) ×1 IMPLANT
SOLN 0.9% NACL POUR BTL 1000ML (IV SOLUTION) ×1 IMPLANT
SOLN STERILE WATER 1000 ML (IV SOLUTION) ×1 IMPLANT
SOLN STERILE WATER BTL 1000 ML (IV SOLUTION) ×1 IMPLANT
SPONGE INTESTINAL PEANUT (DISPOSABLE) ×1 IMPLANT
SPONGE SURGIFOAM ABS GEL 100 (HEMOSTASIS) ×1 IMPLANT
STRIP CLOSURE SKIN 1/2X4 (GAUZE/BANDAGES/DRESSINGS) ×2 IMPLANT
SURGIFLO W/THROMBIN 8M KIT (HEMOSTASIS) IMPLANT
SUT MNCRL AB 4-0 PS2 18 (SUTURE) ×1 IMPLANT
SUT VIC AB 0 CT1 18XCR BRD 8 (SUTURE) ×1 IMPLANT
SUT VIC AB 1 CT1 18XCR BRD 8 (SUTURE) ×1 IMPLANT
SUT VIC AB 2-0 CT2 18 VCP726D (SUTURE) ×1 IMPLANT
SYR 20ML LL LF (SYRINGE) ×2 IMPLANT
SYR BULB IRRIG 60ML STRL (SYRINGE) ×1 IMPLANT
SYR CONTROL 10ML LL (SYRINGE) ×2 IMPLANT
SYR TB 1ML LUER SLIP (SYRINGE) ×1 IMPLANT
TAP EXPEDIUM DL 5.0 (INSTRUMENTS) IMPLANT
TAP EXPEDIUM DL 6.0 (INSTRUMENTS) IMPLANT
TAP EXPEDIUM DL 7X2 (INSTRUMENTS) IMPLANT
TAPE CLOTH SURG 4X10 WHT LF (GAUZE/BANDAGES/DRESSINGS) IMPLANT
TRAY FOLEY MTR SLVR 16FR STAT (SET/KITS/TRAYS/PACK) ×1 IMPLANT
TUBE FUNNEL GL DISP (ORTHOPEDIC DISPOSABLE SUPPLIES) IMPLANT
YANKAUER SUCT BULB TIP NO VENT (SUCTIONS) ×1 IMPLANT

## 2024-04-13 NOTE — Anesthesia Preprocedure Evaluation (Signed)
 Anesthesia Evaluation  Patient identified by MRN, date of birth, ID band Patient awake    Reviewed: Allergy & Precautions, NPO status , Patient's Chart, lab work & pertinent test results  Airway Mallampati: II  TM Distance: >3 FB     Dental no notable dental hx. (+) Teeth Intact, Caps, Dental Advisory Given   Pulmonary neg pulmonary ROS, former smoker   Pulmonary exam normal breath sounds clear to auscultation       Cardiovascular Normal cardiovascular exam+ dysrhythmias  Rhythm:Regular Rate:Normal  EKG 04/10/24 NSR, RBBB + LAFB   Neuro/Psych  PSYCHIATRIC DISORDERS  Depression     Neuromuscular disease    GI/Hepatic negative GI ROS, Neg liver ROS,,,  Endo/Other  Obesity  Renal/GU negative Renal ROS  negative genitourinary   Musculoskeletal Disc osteophyte complex   Abdominal  (+) + obese  Peds  Hematology negative hematology ROS (+)   Anesthesia Other Findings   Reproductive/Obstetrics ED                              Anesthesia Physical Anesthesia Plan  ASA: 2  Anesthesia Plan: General   Post-op Pain Management: Dilaudid  IV, Precedex  and Tylenol  PO (pre-op)*   Induction: Intravenous  PONV Risk Score and Plan: 3 and Treatment may vary due to age or medical condition, Ondansetron  and Dexamethasone   Airway Management Planned: Oral ETT  Additional Equipment: None  Intra-op Plan:   Post-operative Plan: Extubation in OR  Informed Consent: I have reviewed the patients History and Physical, chart, labs and discussed the procedure including the risks, benefits and alternatives for the proposed anesthesia with the patient or authorized representative who has indicated his/her understanding and acceptance.     Dental advisory given  Plan Discussed with: CRNA and Anesthesiologist  Anesthesia Plan Comments:          Anesthesia Quick Evaluation

## 2024-04-13 NOTE — H&P (Signed)
 PREOPERATIVE H&P  Chief Complaint: Left leg pain  HPI: Eric [name] is a 73 y.o. male who presents with ongoing pain in the left leg  MRI reveals stenosis at L2-3, involving the left lateral recess and extraforaminal region  Patient has failed multiple forms of conservative care and continues to have pain (see office notes for additional details regarding the patient's full course of treatment)  Past Medical History:  Diagnosis Date   Anal fissure    Chicken pox    DEPRESSION 12/25/2008   Dyslipidemia    LAFB (left anterior fascicular block)    LIBIDO, DECREASED 12/25/2008   RBBB    SKIN CANCER, HX OF 12/25/2008   Past Surgical History:  Procedure Laterality Date   FRACTURE SURGERY Right 04/2009   right fibula fracture-plate and 6 screws   HX ABNORMAL EKG  2023   Right bundle branch block with left axis -bifascicular block   KNEE ARTHROSCOPY  1994, 1996   right, left   NASAL POLYP SURGERY  2004, 2009, 2012   x 4   SHOULDER ARTHROSCOPY W/ ROTATOR CUFF REPAIR  07/21/2003   TONSILLECTOMY     TRANSFORAMINAL LUMBAR INTERBODY FUSION W/ MIS 1 LEVEL Left 07/09/2022   Procedure: MINIMALLY INVASIVE DECOMPRESSION AND TRANSFORAMINAL LUMBAR INTERBODY FUSION LUMBAR FIVE-SACRAL ONE;  Surgeon: Carollee Lani BROCKS, DO;  Location: [facility] OR;  Service: Neurosurgery;  Laterality: Left;  3C   Social History   Socioeconomic History   Marital status: Divorced    Spouse name: Not on file   Number of children: 3   Years of education: Not on file   Highest education level: Master's degree (e.g., MA, MS, MEng, MEd, MSW, MBA)  Occupational History   Occupation: Consulting civil engineer: [employer]  Tobacco Use   Smoking status: Former    Current packs/day: 0.00    Types: Cigarettes    Quit date: 11/20/2006    Years since quitting: 17.4   Smokeless tobacco: Never  Vaping Use   Vaping status: Never Used  Substance and Sexual Activity   Alcohol  use: Yes    Comment: occasional    Drug use: No   Sexual activity: Not on file  Other Topics Concern   Not on file  Social History Narrative   Not on file   Social Drivers of Health   Financial Resource Strain: Low Risk  (05/03/2023)   Overall Financial Resource Strain (CARDIA)    Difficulty of Paying Living Expenses: Not hard at all  Food Insecurity: No Food Insecurity (05/03/2023)   Hunger Vital Sign    Worried About Running Out of Food in the Last Year: Never true    Ran Out of Food in the Last Year: Never true  Transportation Needs: No Transportation Needs (05/03/2023)   PRAPARE - Administrator, Civil Service (Medical): No    Lack of Transportation (Non-Medical): No  Physical Activity: Sufficiently Active (05/03/2023)   Exercise Vital Sign    Days of Exercise per Week: 6 days    Minutes of Exercise per Session: 120 min  Stress: Stress Concern Present (05/03/2023)   [employer] of Occupational Health - Occupational Stress Questionnaire    Feeling of Stress : To some extent  Social Connections: Moderately Integrated (05/03/2023)   Social Connection and Isolation Panel    Frequency of Communication with Friends and Family: More than three times a week    Frequency of Social Gatherings with Friends and Family: More  than three times a week    Attends Religious Services: 1 to 4 times per year    Active Member of Clubs or Organizations: Yes    Attends Engineer, structural: More than 4 times per year    Marital Status: Divorced   Family History  Problem Relation Age of Onset   Alzheimer's disease Father 43   Cancer Father        melanoma   Multiple sclerosis Brother    Parkinson's disease Brother    Allergies  Allergen Reactions   Ciprofloxacin Diarrhea    REACTION: GI upset, C-diff   Metronidazole  Hives   Flagyl  [Metronidazole  Hcl] Rash   Prior to Admission medications   Medication Sig Start Date End Date Taking? Authorizing Provider  acetaminophen  (TYLENOL ) 500 MG tablet  Take 500-1,000 mg by mouth every 6 (six) hours as needed (pain.).   Yes [provider]  cetirizine (ZYRTEC) 10 MG tablet Take 10 mg by mouth in the morning.   Yes [provider]  Cholecalciferol  (VITAMIN D3 PO) Take 1 tablet by mouth in the morning.   Yes [provider]  fluticasone  (FLONASE ) 50 MCG/ACT nasal spray Place 1 spray into both nostrils daily as needed for allergies.   Yes [provider]  melatonin 5 MG TABS Take 5 mg by mouth at bedtime as needed (sleep).   Yes [provider]  Multiple Vitamin (MULTIVITAMIN WITH MINERALS) TABS tablet Take 1 tablet by mouth in the morning.   Yes [provider]  naproxen sodium (ALEVE) 220 MG tablet Take 220-440 mg by mouth 2 (two) times daily as needed (pain.).   Yes [provider]  Polyethyl Glycol-Propyl Glycol (SYSTANE) 0.4-0.3 % SOLN Place 1-2 drops into both eyes 3 (three) times daily as needed (dry/iritated eyes.).   Yes [provider]  tadalafil  (CIALIS ) 20 MG tablet TAKE 1 TABLET BY MOUTH EVERY OTHER DAY AS NEEDED FOR ERECTILE DYSFUNCTION 02/07/24  Yes Burchette, Wolm ORN, MD     All other systems have been reviewed and were otherwise negative with the exception of those mentioned in the HPI and as above.  Physical Exam: Vitals:   04/13/24 0852  BP: 135/80  Pulse: 91  Resp: 18  Temp: 97.8 F (36.6 C)  SpO2: 95%    Body mass index is 31.42 kg/m.  General: Alert, no acute distress Cardiovascular: No pedal edema Respiratory: No cyanosis, no use of accessory musculature Skin: No lesions in the area of chief complaint Neurologic: Sensation intact distally Psychiatric: Patient is competent for consent with normal mood and affect Lymphatic: No axillary or cervical lymphadenopathy   Assessment/Plan:  Plan for Procedure(s): Lumbar decompression and left-sided TRANSFORAMINAL LUMBAR INTERBODY FUSION (TLIF) WITH PEDICLE SCREW FIXATION, L2-3   Oneil LITTIE Priestly, MD 04/13/2024 10:08 AM

## 2024-04-13 NOTE — Anesthesia Procedure Notes (Signed)
 Procedure Name: Intubation Date/Time: 04/13/2024 11:05 AM  Performed by: Jolynn Mage, CRNAPre-anesthesia Checklist: Patient identified, Patient being monitored, Timeout performed, Emergency Drugs available and Suction available Patient Re-evaluated:Patient Re-evaluated prior to induction Oxygen Delivery Method: Circle system utilized Preoxygenation: Pre-oxygenation with 100% oxygen Induction Type: IV induction Ventilation: Mask ventilation without difficulty Laryngoscope Size: Mac and 4 Grade View: Grade II Tube type: Oral Tube size: 7.5 mm Number of attempts: 1 Airway Equipment and Method: Stylet Placement Confirmation: ETT inserted through vocal cords under direct vision, positive ETCO2 and breath sounds checked- equal and bilateral Secured at: 23 cm Tube secured with: Tape Dental Injury: Teeth and Oropharynx as per pre-operative assessment

## 2024-04-13 NOTE — Anesthesia Postprocedure Evaluation (Signed)
 Anesthesia Post Note  Patient: Eric [name]  Procedure(s) Performed: LEFT SIDED LUMBAR TWO - LUMBAR THREE TRANSFORAMINAL LUMBAR INTERBODY FUSION AND DECOMPRESSION WITH INSTRUMENTATION AND ALLOGRAFT (Left: Spine Lumbar)     Patient location during evaluation: PACU Anesthesia Type: General Level of consciousness: awake and alert and oriented Pain management: pain level controlled Vital Signs Assessment: post-procedure vital signs reviewed and stable Respiratory status: spontaneous breathing, nonlabored ventilation and respiratory function stable Cardiovascular status: blood pressure returned to baseline and stable Postop Assessment: no apparent nausea or vomiting Anesthetic complications: no   No notable events documented.  Last Vitals:  Vitals:   04/13/24 1515 04/13/24 1530  BP: 125/80 126/87  Pulse: 81 75  Resp: 13 11  Temp:    SpO2: 97% 97%    Last Pain:  Vitals:   04/13/24 0917  TempSrc:   PainSc: 0-No pain      LLE Sensation: Full sensation (04/13/24 1530)   RLE Sensation: Full sensation (04/13/24 1530)      Derk Doubek A.

## 2024-04-13 NOTE — Op Note (Signed)
 PATIENT NAME: Eric [name]   MEDICAL RECORD NO.:   [id number]    DATE OF BIRTH: 05/24/1951   DATE OF PROCEDURE: 04/13/2024                               OPERATIVE REPORT     PREOPERATIVE DIAGNOSES: 1. Left-sided lumbar radiculopathy 2. L2/3 stenosis, involving the left lateral recess and extraforaminal region  3. Left-sided L2/3 segmental collapse   POSTOPERATIVE DIAGNOSES: 1. Left-sided lumbar radiculopathy 2. L2/3 stenosis, involving the left lateral recess and extraforaminal region  3. Left-sided L2/3 segmental collapse   PROCEDURES: 1. L2-3 decompression. 2. Left-sided L2-3 transforaminal lumbar interbody fusion 3. Right-sided L3-4 posterolateral fusion 4. Insertion of interbody device x1 (Globus expandable intervertebral spacer). 5. Placement of posterior instrumentation at L2, L3 bilaterally 6. Use of local autograft 7. Use of morselized allograft - ViviGen 8. Intraoperative use of fluoroscopy   SURGEON:  Oneil Priestly, MD.   ASSISTANTBETHA Ileana Clara, PA-C.   ANESTHESIA:  General endotracheal anesthesia.   COMPLICATIONS:  None.   DISPOSITION:  Stable.   ESTIMATED BLOOD LOSS:   Minimal   INDICATIONS FOR SURGERY:  Briefly, Mr. Radloff is a pleasant 72 y.o. -year-old male who did present to me with severe and ongoing pain in the left leg. I did feel that the symptoms were secondary to the findings noted above, as the L2-3 level was the only level in his back with left-sided neurologic compression. The patient failed conservative care and did wish to proceed with the procedure  noted above.   OPERATIVE DETAILS:  On 04/13/2024, the patient was brought to surgery and general endotracheal anesthesia was administered.  The patient was placed prone on a well-padded flat Jackson bed with a spinal frame.  Antibiotics were given and a time-out procedure was performed. The back was prepped and draped in the usual fashion.  A midline incision was made overlying the  L2-3 intervertebral space.  The fascia was incised at the midline.  The paraspinal musculature was bluntly swept laterally.  Anatomic landmarks for the pedicles were exposed. Using fluoroscopy, I did cannulate the L2 and L3 pedicles bilaterally, using a medial to lateral cortical trajectory technique.  On the right side, the posterolateral gutter and right facet joint at L2-3 was decorticated and 6 x 30 mm screws were placed and a 45 mm rod was placed and distraction was applied across the rod on the right side.  On the left side, the cannulated pedicle holes were filled with bone wax.  I then proceeded with the decompressive aspect of the procedure.  On the left side, I did perform a thorough and complete lateral recess and neuroforaminal decompression, with near-complete removal of the left L2-3 facet joint. The left exiting L2 nerve and traversing L3 nerve was entirely decompressed. With an assistant holding medial retraction of the traversing left L3 nerve, I did perform an annulotomy at the posterolateral aspect of the L2-3 intervertebral space.  I then used a series of curettes and pituitary rongeurs to perform a thorough and complete intervertebral diskectomy.  The intervertebral space was then liberally packed with autograft as well as allograft in the form of ViviGen, as was the appropriate-sized intervertebral spacer.  The spacer was then tamped into position in the usual fashion and expanded to 11 mm in height.  I was very pleased with the press-fit of the spacer.  I then placed 6 x 30 mm screws  on the left at L2 and L3.  A 45 mm rod was then placed and caps were placed. The distraction was then released on the contralateral right side.  All 4 caps were then locked.  The wound was copiously irrigated with a total of approximately 3 L prior to placing the bone graft.  Additional autograft and allograft were then packed into the posterolateral gutter on the right side to help aid in  the L2-3 fusion.  The wound was explored for any undue bleeding and there was no substantial bleeding encountered.  Gel-Foam was placed over the laminectomy site.  The wound was then closed in layers using #1 Vicryl followed by 2-0 Vicryl, followed by 4-0 Monocryl.  Benzoin and Steri-Strips were applied followed by sterile dressing.    Of note, Ileana Clara was my assistant throughout surgery, and did aid in retraction, suctioning, and closure.     Oneil Priestly, MD

## 2024-04-13 NOTE — Transfer of Care (Signed)
 Immediate Anesthesia Transfer of Care Note  Patient: Rylen Swindler  Procedure(s) Performed: LEFT SIDED LUMBAR TWO - LUMBAR THREE TRANSFORAMINAL LUMBAR INTERBODY FUSION AND DECOMPRESSION WITH INSTRUMENTATION AND ALLOGRAFT (Left: Spine Lumbar)  Patient Location: PACU  Anesthesia Type:General  Level of Consciousness: awake, alert , and patient cooperative  Airway & Oxygen Therapy: Patient Spontanous Breathing and Patient connected to face mask oxygen  Post-op Assessment: Report given to RN, Post -op Vital signs reviewed and stable, and Patient moving all extremities X 4  Post vital signs: Reviewed and stable  Last Vitals:  Vitals Value Taken Time  BP 151/82 04/13/24 14:16  Temp    Pulse 81 04/13/24 14:21  Resp 17 04/13/24 14:21  SpO2 99 % 04/13/24 14:21  Vitals shown include unfiled device data.  Last Pain:  Vitals:   04/13/24 0917  TempSrc:   PainSc: 0-No pain         Complications: No notable events documented.

## 2024-04-14 ENCOUNTER — Encounter: Payer: Self-pay | Admitting: Family Medicine

## 2024-04-14 ENCOUNTER — Encounter (HOSPITAL_COMMUNITY): Payer: Self-pay | Admitting: Orthopedic Surgery

## 2024-04-14 DIAGNOSIS — M4856XA Collapsed vertebra, not elsewhere classified, lumbar region, initial encounter for fracture: Secondary | ICD-10-CM | POA: Diagnosis not present

## 2024-04-14 DIAGNOSIS — M48061 Spinal stenosis, lumbar region without neurogenic claudication: Secondary | ICD-10-CM | POA: Diagnosis not present

## 2024-04-14 DIAGNOSIS — Z87891 Personal history of nicotine dependence: Secondary | ICD-10-CM | POA: Diagnosis not present

## 2024-04-14 DIAGNOSIS — M5416 Radiculopathy, lumbar region: Secondary | ICD-10-CM | POA: Diagnosis not present

## 2024-04-14 MED ORDER — OXYCODONE-ACETAMINOPHEN 5-325 MG PO TABS: 1.0000 | ORAL_TABLET | ORAL | 0 refills | Status: AC | PRN

## 2024-04-14 MED ORDER — METHOCARBAMOL 500 MG PO TABS: 500.0000 mg | ORAL_TABLET | Freq: Four times a day (QID) | ORAL | 2 refills | Status: AC | PRN

## 2024-04-14 MED FILL — Thrombin For Soln 20000 Unit: CUTANEOUS | Qty: 1 | Status: AC

## 2024-04-14 NOTE — Evaluation (Signed)
 Occupational Therapy Evaluation and Discharge Patient Details Name: Eric [name] MRN: [id number] DOB: 04/29/1951 Today's Date: 04/14/2024   History of Present Illness   Pt is a 73 yo s/p L2-3 decompression, left-sided L2-3 transforaminal lumbar interbody fusion, and right-sided L3-4 posterolateral fusion.     Clinical Impressions This 73 yo male admitted and underwent above presents to acute OT with PLOF of being independent with basic ADLs, IADLs, and driving. He currently is Mod I with all basic ADLs and all education has been completed and post op back surgery handout provided and reviewed. No further OT needs, we will sign off.      If plan is discharge home, recommend the following:   Assistance with cooking/housework;Assist for transportation     Functional Status Assessment   Patient has had a recent decline in their functional status and demonstrates the ability to make significant improvements in function in a reasonable and predictable amount of time. (without further need for skilled OT)     Equipment Recommendations   None recommended by OT      Precautions/Restrictions   Precautions Precautions: Back;Fall Precaution Booklet Issued: Yes (comment) Recall of Precautions/Restrictions: Intact Required Braces or Orthoses: Spinal Brace Spinal Brace: Thoracolumbosacral orthotic;Applied in sitting position Restrictions Weight Bearing Restrictions Per Provider Order: No     Mobility Bed Mobility               General bed mobility comments: Pt sitting on EOB upon arrival    Transfers Overall transfer level: Modified independent Equipment used: None Transfers: Sit to/from Stand                    Balance Overall balance assessment: Mild deficits observed, not formally tested (in standing)                                         ADL either performed or assessed with clinical judgement   ADL Overall ADL's : Modified  independent                                       General ADL Comments: no AD, increased time, able to cross his legs in an alternating figure 4 pattern to get to his feet for LB ADLs. Post op back handout provided to patient.     Vision Patient Visual Report: No change from baseline              Pertinent Vitals/Pain Pain Assessment Pain Assessment: 0-10 Pain Score: 7  Pain Location: incisional Pain Descriptors / Indicators: Grimacing, Guarding, Sore Pain Intervention(s): Limited activity within patient's tolerance, Monitored during session, Repositioned, Patient requesting pain meds-RN notified, RN gave pain meds during session     Extremity/Trunk Assessment Upper Extremity Assessment Upper Extremity Assessment: Overall WFL for tasks assessed      Communication Communication Communication: Impaired Factors Affecting Communication: Hearing impaired   Cognition Arousal: Alert Behavior During Therapy: WFL for tasks assessed/performed                                 Following commands: Intact       Cueing   Cueing Techniques: Verbal cues;Tactile cues             Home  Living Family/patient expects to be discharged to:: Private residence Living Arrangements: Alone Available Help at Discharge: Neighbor;Friend(s);Available PRN/intermittently Type of Home: House Home Access: Stairs to enter [employer] of Steps: 1 Entrance Stairs-Rails: None Home Layout: One level     Bathroom Shower/Tub: Walk-in shower;Tub/shower unit   Bathroom Toilet: Standard     Home Equipment: Agricultural consultant (2 wheels)          Prior Functioning/Environment Prior Level of Function : Independent/Modified Independent;Driving             Mobility Comments: Ind with no AD, plays pickleball x5 days a week ADLs Comments: Independent    OT Problem List: Decreased range of motion;Pain;Impaired balance (sitting and/or standing)         OT Goals(Current goals can be found in the care plan section)   Acute Rehab OT Goals Patient Stated Goal: to go home today         AM-PAC OT 6 Clicks Daily Activity     Outcome Measure Help from another person eating meals?: None Help from another person taking care of personal grooming?: None Help from another person toileting, which includes using toliet, bedpan, or urinal?: None Help from another person bathing (including washing, rinsing, drying)?: None Help from another person to put on and taking off regular upper body clothing?: None Help from another person to put on and taking off regular lower body clothing?: None 6 Click Score: 24   End of Session Equipment Utilized During Treatment: Back brace  Activity Tolerance: Patient tolerated treatment well Patient left:  (sitting EOB with RN in room)  OT Visit Diagnosis: Unsteadiness on feet (R26.81);Pain Pain - part of body:  (incisional)                Time: 9064-9045 OT Time Calculation (min): 19 min Charges:  OT General Charges $OT Visit: 1 Visit OT Evaluation $OT Eval Moderate Complexity: 1 Mod  Cathy L. OT Acute Rehabilitation Services Office [phone]    Rodgers Dorothyann Distel 04/14/2024, 10:16 AM

## 2024-04-14 NOTE — Care Management Obs Status (Signed)
 MEDICARE OBSERVATION STATUS NOTIFICATION   Patient Details  Name: Eric [name] MRN: [id number] Date of Birth: 1951/07/09   Medicare Observation Status Notification Given:  Yes    Eric [name] 04/14/2024, 9:21 AM

## 2024-04-14 NOTE — Progress Notes (Signed)
    Patient doing well  Patient denies leg pain   Physical Exam: Vitals:   04/14/24 0555 04/14/24 0746  BP: (!) 155/89 109/72  Pulse: 94 99  Resp: 18 19  Temp:  (!) 97.5 F (36.4 C)  SpO2: 99% 96%    Dressing in place NVI  POD #1 s/p L2/3 decompression and fusion, doing well  - up with PT/OT, encourage ambulation - Percocet for pain, Robaxin  for muscle spasms - d/c home today with f/u in 2 weeks

## 2024-04-14 NOTE — Evaluation (Addendum)
 Physical Therapy Evaluation Patient Details Name: Eric [name] MRN: [id number] DOB: 1950/08/26 Today's Date: 04/14/2024  History of Present Illness  Pt is a 73 yo s/p L2-3 decompression, left-sided L2-3 transforaminal lumbar interbody fusion, and right-sided L3-4 posterolateral fusion.  Clinical Impression  Pt in bed upon arrival and agreeable to PT eval. PTA pt was independent for mobility and playing pickleball 5 days a week. Educated on spinal precautions, brace wearing schedule, walking program, car transfer, and activity modification. Pt required minimal assist to don brace. Able to ambulate 175ft with RW and supervision. Pt declined stair trial with visual demonstration provided. Pt was agreeable to have assist to enter/exit his home with education on proper guarding. Pt will have intermittent assist available upon d/c home. No follow-up PT recommended at this time with acute PT to follow.         If plan is discharge home, recommend the following: Assist for transportation;Help with stairs or ramp for entrance   Can travel by private vehicle    Yes    Equipment Recommendations None recommended by PT     Functional Status Assessment Patient has had a recent decline in their functional status and demonstrates the ability to make significant improvements in function in a reasonable and predictable amount of time.     Precautions / Restrictions Precautions Precautions: Back;Fall Precaution Booklet Issued: Yes (comment) Recall of Precautions/Restrictions: Intact Required Braces or Orthoses: Spinal Brace Spinal Brace: Thoracolumbosacral orthotic;Applied in sitting position      Mobility  Bed Mobility Overal bed mobility: Needs Assistance Bed Mobility: Rolling, Sidelying to Sit, Sit to Sidelying Rolling: Supervision Sidelying to sit: Supervision, Used rails    Sit to sidelying: Supervision General bed mobility comments: able to recall log roll technique     Transfers Overall transfer level: Needs assistance Equipment used: Rolling walker (2 wheels) Transfers: Sit to/from Stand Sit to Stand: Supervision   General transfer comment: for safety    Ambulation/Gait Ambulation/Gait assistance: Supervision Gait Distance (Feet): 160 Feet Assistive device: Rolling walker (2 wheels) Gait Pattern/deviations: Step-through pattern, Decreased stride length Gait velocity: decr     General Gait Details: slow and steady gait  Stairs Stairs:  (pt declined)        Balance Overall balance assessment: Needs assistance Sitting-balance support: No upper extremity supported, Feet supported Sitting balance-Leahy Scale: Good     Standing balance support: Bilateral upper extremity supported, During functional activity, Reliant on assistive device for balance Standing balance-Leahy Scale: Poor Standing balance comment: reliant on UE support       Pertinent Vitals/Pain Pain Assessment Pain Assessment: No/denies pain    Home Living Family/patient expects to be discharged to:: Private residence Living Arrangements: Alone Available Help at Discharge: Neighbor;Friend(s);Available PRN/intermittently Type of Home: House Home Access: Stairs to enter Entrance Stairs-Rails: None Entrance Stairs-Number of Steps: 1   Home Layout: One level Home Equipment: Agricultural consultant (2 wheels)      Prior Function Prior Level of Function : Independent/Modified Independent;Driving    Mobility Comments: Ind with no AD, plays pickleball x5 days a week ADLs Comments: Ind     Extremity/Trunk Assessment   Upper Extremity Assessment Upper Extremity Assessment: Defer to OT evaluation    Lower Extremity Assessment Lower Extremity Assessment: Overall WFL for tasks assessed    Cervical / Trunk Assessment Cervical / Trunk Assessment: Back Surgery  Communication   Communication Communication: Impaired Factors Affecting Communication: Hearing impaired     Cognition Arousal: Alert Behavior During Therapy: [facility] for tasks assessed/performed  PT - Cognitive impairments: No apparent impairments    Following commands: Intact       Cueing Cueing Techniques: Verbal cues, Tactile cues     General Comments General comments (skin integrity, edema, etc.): VSS on RA     PT Assessment Patient needs continued PT services  PT Problem List Decreased activity tolerance;Decreased balance;Decreased mobility       PT Treatment Interventions DME instruction;Gait training;Stair training;Functional mobility training;Therapeutic activities;Therapeutic exercise;Balance training;Neuromuscular re-education;Patient/family education    PT Goals (Current goals can be found in the Care Plan section)  Acute Rehab PT Goals Patient Stated Goal: to get back to pickleball PT Goal Formulation: With patient Time For Goal Achievement: 04/28/24 Potential to Achieve Goals: Good    Frequency Min 5X/week        AM-PAC PT 6 Clicks Mobility  Outcome Measure Help needed turning from your back to your side while in a flat bed without using bedrails?: A Little Help needed moving from lying on your back to sitting on the side of a flat bed without using bedrails?: A Little Help needed moving to and from a bed to a chair (including a wheelchair)?: A Little Help needed standing up from a chair using your arms (e.g., wheelchair or bedside chair)?: A Little Help needed to walk in hospital room?: A Little Help needed climbing 3-5 steps with a railing? : A Little 6 Click Score: 18    End of Session Equipment Utilized During Treatment: Back brace Activity Tolerance: Patient tolerated treatment well Patient left: in bed;with call bell/phone within reach Nurse Communication: Mobility status PT Visit Diagnosis: Other abnormalities of gait and mobility (R26.89)    Time: 9177-9160 PT Time Calculation (min) (ACUTE ONLY): 17 min   Charges:   PT Evaluation $PT Eval Low  Complexity: 1 Low   PT General Charges $$ ACUTE PT VISIT: 1 Visit        Kate ORN, PT, DPT Secure Chat Preferred  Rehab Office [phone]   Kate BRAVO Wendolyn 04/14/2024, 9:12 AM

## 2024-04-14 NOTE — Progress Notes (Signed)
 Patient alert and oriented, surgical site clean and dry no sign of infection, patient voided, ambulate. D/c instructions explain and given, all questions answered.

## 2024-05-04 NOTE — Discharge Summary (Signed)
 Patient ID: Eric [name] MRN: [id number] DOB/AGE: February 25, 1951 73 y.o.  Admit date: 04/13/2024 Discharge date: 04/14/2024  Admission Diagnoses:  Principal Problem:   Radiculopathy, lumbar region   Discharge Diagnoses:  Same  Past Medical History:  Diagnosis Date   Anal fissure    Chicken pox    DEPRESSION 12/25/2008   Dyslipidemia    LAFB (left anterior fascicular block)    LIBIDO, DECREASED 12/25/2008   RBBB    SKIN CANCER, HX OF 12/25/2008    Surgeries: Procedure(s): LEFT SIDED LUMBAR TWO - LUMBAR THREE TRANSFORAMINAL LUMBAR INTERBODY FUSION AND DECOMPRESSION WITH INSTRUMENTATION AND ALLOGRAFT on 04/13/2024   Consultants: None  Discharged Condition: Improved  Hospital Course: Breckon Reeves is an 73 y.o. male who was admitted 04/13/2024 for operative treatment of Radiculopathy, lumbar region. Patient has severe unremitting pain that affects sleep, daily activities, and work/hobbies. After pre-op clearance the patient was taken to the operating room on 04/13/2024 and underwent  Procedure(s): LEFT SIDED LUMBAR TWO - LUMBAR THREE TRANSFORAMINAL LUMBAR INTERBODY FUSION AND DECOMPRESSION WITH INSTRUMENTATION AND ALLOGRAFT.    Patient was given perioperative antibiotics:  Anti-infectives (From admission, onward)    Start     Dose/Rate Route Frequency Ordered Stop   04/13/24 1830  ceFAZolin  (ANCEF ) IVPB 2g/100 mL premix        2 g 200 mL/hr over 30 Minutes Intravenous Every 8 hours 04/13/24 1741 04/14/24 0815   04/13/24 0900  ceFAZolin  (ANCEF ) IVPB 2g/100 mL premix        2 g 200 mL/hr over 30 Minutes Intravenous On call to O.R. 04/13/24 0857 04/13/24 1140        Patient was given sequential compression devices, early ambulation to prevent DVT.  Patient benefited maximally from hospital stay and there were no complications.    Recent vital signs: BP 109/72 (BP Location: Right Arm)   Pulse 99   Temp (!) 97.5 F (36.4 C) (Oral)   Resp 19   Ht 5' 10 (1.778 m)    Wt 99.3 kg   SpO2 96%   BMI 31.42 kg/m    Discharge Medications:   Allergies as of 04/14/2024       Reactions   Ciprofloxacin Diarrhea   REACTION: GI upset, C-diff   Metronidazole  Hives   Flagyl  [metronidazole  Hcl] Rash        Medication List     TAKE these medications    acetaminophen  500 MG tablet Commonly known as: TYLENOL  Take 500-1,000 mg by mouth every 6 (six) hours as needed (pain.).   cetirizine 10 MG tablet Commonly known as: ZYRTEC Take 10 mg by mouth in the morning.   fluticasone  50 MCG/ACT nasal spray Commonly known as: FLONASE  Place 1 spray into both nostrils daily as needed for allergies.   melatonin 5 MG Tabs Take 5 mg by mouth at bedtime as needed (sleep).   methocarbamol  500 MG tablet Commonly known as: ROBAXIN  Take 1 tablet (500 mg total) by mouth every 6 (six) hours as needed for muscle spasms.   multivitamin with minerals Tabs tablet Take 1 tablet by mouth in the morning.   oxyCODONE -acetaminophen  5-325 MG tablet Commonly known as: PERCOCET/ROXICET Take 1-2 tablets by mouth every 4 (four) hours as needed for severe pain (pain score 7-10).   Systane 0.4-0.3 % Soln Generic drug: Polyethyl Glycol-Propyl Glycol Place 1-2 drops into both eyes 3 (three) times daily as needed (dry/iritated eyes.).   tadalafil  20 MG tablet Commonly known as: CIALIS  TAKE 1 TABLET BY MOUTH  EVERY OTHER DAY AS NEEDED FOR ERECTILE DYSFUNCTION   VITAMIN D3 PO Take 1 tablet by mouth in the morning.        Diagnostic Studies: DG Lumbar Spine 2-3 Views Result Date: 04/13/2024 CLINICAL DATA:  Elective surgery. EXAM: LUMBAR SPINE - 2-3 VIEW COMPARISON:  Radiograph 09/09/2022 FINDINGS: Three fluoroscopic spot views of the lumbar spine submitted from the operating room. Previous L5-S1 fusion hardware. No posterior rod and pedicle screw fixation at L2-L3 with interbody spacer. Fluoroscopy time 50.6 seconds. Dose 49.44 mGy. IMPRESSION: Intraoperative fluoroscopy during  lumbar fusion. Electronically Signed   By: Andrea Gasman M.D.   On: 04/13/2024 15:28   DG Lumbar Spine 1 View Result Date: 04/13/2024 CLINICAL DATA:  Elective surgery. EXAM: LUMBAR SPINE - 1 VIEW COMPARISON:  Radiograph 09/09/2022 FINDINGS: Single cross-table lateral view of the lumbar spine submitted from the operating room. Previous L5-S1 fusion hardware in place. Surgical instruments localize posteriorly at the T12 at and L1-L2 level. IMPRESSION: Surgical instruments localize posteriorly at the T12 and L1-L2 level. Electronically Signed   By: Andrea Gasman M.D.   On: 04/13/2024 15:27   DG C-Arm 1-60 Min-No Report Result Date: 04/13/2024 Fluoroscopy was utilized by the requesting physician.  No radiographic interpretation.   DG C-Arm 1-60 Min-No Report Result Date: 04/13/2024 Fluoroscopy was utilized by the requesting physician.  No radiographic interpretation.   DG C-Arm 1-60 Min-No Report Result Date: 04/13/2024 Fluoroscopy was utilized by the requesting physician.  No radiographic interpretation.    Disposition: Discharge disposition: 01-Home or Self Care        POD #1 s/p L2/3 decompression and fusion, doing well   - up with PT/OT, encourage ambulation - Percocet for pain, Robaxin  for muscle spasms -Scripts for pain sent to pharmacy electronically  -D/C instructions sheet printed and in chart -D/C today  -F/U in office 2 weeks   Signed: Ileana PARAS Christyana Corwin 05/04/2024, 10:04 AM

## 2024-05-12 DIAGNOSIS — M79672 Pain in left foot: Secondary | ICD-10-CM | POA: Diagnosis not present

## 2024-05-12 DIAGNOSIS — T169XXA Foreign body in ear, unspecified ear, initial encounter: Secondary | ICD-10-CM | POA: Diagnosis not present

## 2024-05-12 DIAGNOSIS — J209 Acute bronchitis, unspecified: Secondary | ICD-10-CM | POA: Diagnosis not present

## 2024-05-16 ENCOUNTER — Ambulatory Visit: Admitting: Family Medicine

## 2024-05-22 DIAGNOSIS — Z4789 Encounter for other orthopedic aftercare: Secondary | ICD-10-CM | POA: Diagnosis not present

## 2024-06-28 DIAGNOSIS — J209 Acute bronchitis, unspecified: Secondary | ICD-10-CM | POA: Diagnosis not present

## 2024-08-21 ENCOUNTER — Ambulatory Visit
